# Patient Record
Sex: Male | Born: 1970 | Race: Black or African American | Hispanic: No | Marital: Married | Smoking: Never smoker
Health system: Southern US, Community
[De-identification: ages and names within clinical notes are randomized; demographics above are authoritative.]

## PROBLEM LIST (undated history)

## (undated) DIAGNOSIS — K429 Umbilical hernia without obstruction or gangrene: Secondary | ICD-10-CM

## (undated) DIAGNOSIS — G4733 Obstructive sleep apnea (adult) (pediatric): Secondary | ICD-10-CM

## (undated) HISTORY — DX: Umbilical hernia without obstruction or gangrene: K42.9

## (undated) HISTORY — DX: Obstructive sleep apnea (adult) (pediatric): G47.33

## (undated) HISTORY — PX: NO PAST SURGERIES: SHX2092

---

## 2005-08-25 ENCOUNTER — Emergency Department (HOSPITAL_COMMUNITY): Admission: EM | Admit: 2005-08-25 | Discharge: 2005-08-25 | Payer: Self-pay | Admitting: Emergency Medicine

## 2013-08-01 ENCOUNTER — Encounter: Payer: Self-pay | Admitting: Physician Assistant

## 2013-08-01 ENCOUNTER — Ambulatory Visit (INDEPENDENT_AMBULATORY_CARE_PROVIDER_SITE_OTHER): Payer: BC Managed Care – PPO | Admitting: Physician Assistant

## 2013-08-01 VITALS — BP 124/70 | HR 60 | Temp 98.3°F | Resp 16 | Wt 236.0 lb

## 2013-08-01 DIAGNOSIS — Z0000 Encounter for general adult medical examination without abnormal findings: Secondary | ICD-10-CM

## 2013-08-01 NOTE — Progress Notes (Signed)
   Patient ID: Manuel Hernandez is a 43 y.o. male DOB: [id number] 24, 1972 MRN: [id number]     HPI:  Patient is a 43 year old male who present to the office to establish care. Patient has not had a PCP in a number of years. Patient is married and works as a Systems analystpersonal trainer. Considers himself in good health with no concerns at this time. Reports takes an OTC multi-vitamin daily. Denies chest pain/palpitations, SOB, cough, extremity swelling, pain/difficulty swallowing, change in bowel/bladder habits, visual change/disturbance, numbness, tingling or weakness.     Influenza: does not take Tetanus: unknown  ROS: As stated in HPI. All other systems negative  History reviewed. No pertinent past medical history. Family History  Problem Relation Age of Onset  . Hypertension Mother    History   Social History  . Marital Status: Married    Spouse Name: N/A    Number of Children: N/A  . Years of Education: N/A   Social History Main Topics  . Smoking status: Never Smoker   . Smokeless tobacco: None  . Alcohol Use: Yes  . Drug Use: None  . Sexual Activity: None   Other Topics Concern  . None   Social History Narrative  . None   History reviewed. No pertinent past surgical history. No current outpatient prescriptions on file prior to visit.   No current facility-administered medications on file prior to visit.   Not on File  PE:  Filed Vitals:   08/01/13 1117  BP: 124/70  Pulse: 60  Temp: 98.3 F (36.8 C)  Resp: 16    CONSTITUTIONAL: Well developed, well nourished, pleasant, appears stated age, in NAD HEENT: normocephalic, atraumatic, bilateral ext/int canals normal. Bilateral TM's without injections, bulging, erythema. Nose normal, uvula midline, oropharynx clear and moist. EYES: PERRLA, bilateral EOM and conjunctiva normal NECK: FROM, supple, without thyromegaly or mass CARDIO: RRR, normal S1 and S2, distal pulses intact. PULM/CHEST CTA bilateral, no wheezes, rales or rhonchi. Non  tender. ABD: appearance normal, soft, nontender. Normal bowel sounds x 4 quadrants, non palpable spleen, liver, kidney MUSC: FROM U/LE bilateral, FROM thoracic and lumbar spine, no midline tenderness.  LYMPH: no cervical, supraclavicular adenopathy NEURO: alert and oriented x 3, no cranial nerve deficit, motor strength and coordination NL. DTR's intact. Gait normal. SKIN: warm, dry, no rash or lesions noted. PSYCH: Mood and affect normal, speech normal.   ASSESSMENT and PLAN   CPX/v70.0 - Patient has been counseled on age-appropriate routine health concerns for screening and prevention. These are reviewed and up-to-date. Immunizations are up-to-date or declined. Labs ordered and will be reviewed.  RTO in one year unless needed sooner.

## 2013-08-01 NOTE — Progress Notes (Signed)
Pre visit review using our clinic review tool, if applicable. No additional management support is needed unless otherwise documented below in the visit note. 

## 2013-08-01 NOTE — Patient Instructions (Signed)
It was great meeting you today [name]!   Labs have been ordered for you, when you report to lab please be fasting.    Health Maintenance, Males A healthy lifestyle and preventative care can promote health and wellness.  Maintain regular health, dental, and eye exams.  Eat a healthy diet. Foods like vegetables, fruits, whole grains, low-fat dairy products, and lean protein foods contain the nutrients you need and are low in calories. Decrease your intake of foods high in solid fats, added sugars, and salt. Get information about a proper diet from your health care provider, if necessary.  Regular physical exercise is one of the most important things you can do for your health. Most adults should get at least 150 minutes of moderate-intensity exercise (any activity that increases your heart rate and causes you to sweat) each week. In addition, most adults need muscle-strengthening exercises on 2 or more days a week.   Maintain a healthy weight. The body mass index (BMI) is a screening tool to identify possible weight problems. It provides an estimate of body fat based on height and weight. Your health care provider can find your BMI and can help you achieve or maintain a healthy weight. For males 20 years and older:  A BMI below 18.5 is considered underweight.  A BMI of 18.5 to 24.9 is normal.  A BMI of 25 to 29.9 is considered overweight.  A BMI of 30 and above is considered obese.  Maintain normal blood lipids and cholesterol by exercising and minimizing your intake of saturated fat. Eat a balanced diet with plenty of fruits and vegetables. Blood tests for lipids and cholesterol should begin at age 10620 and be repeated every 5 years. If your lipid or cholesterol levels are high, you are over 50, or you are at high risk for heart disease, you may need your cholesterol levels checked more frequently.Ongoing high lipid and cholesterol levels should be treated with medicines, if diet and  exercise are not working.  If you smoke, find out from your health care provider how to quit. If you do not use tobacco, do not start.  Lung cancer screening is recommended for adults aged 41 80 years who are at high risk for developing lung cancer because of a history of smoking. A yearly low-dose CT scan of the lungs is recommended for people who have at least a 30-pack-year history of smoking and are a current smoker or have quit within the past 15 years. A pack year of smoking is smoking an average of 1 pack of cigarettes a day for 1 year (for example, a 30-pack-year history of smoking could mean smoking 1 pack a day for 30 years or 2 packs a day for 15 years). Yearly screening should continue until the smoker has stopped smoking for at least 15 years. Yearly screening should be stopped for people who develop a health problem that would prevent them from having lung cancer treatment.  If you choose to drink alcohol, do not have more than 2 drinks per day. One drink is considered to be 12 oz (360 mL) of beer, 5 oz (150 mL) of wine, or 1.5 oz (45 mL) of liquor.  Avoid use of street drugs. Do not share needles with anyone. Ask for help if you need support or instructions about stopping the use of drugs.  High blood pressure causes heart disease and increases the risk of stroke. Blood pressure should be checked at least every 1 2 years. Ongoing  high blood pressure should be treated with medicines if weight loss and exercise are not effective.  If you are 96 41 years old, ask your health care provider if you should take aspirin to prevent heart disease.  Diabetes screening involves taking a blood sample to check your fasting blood sugar level. This should be done once every 3 years after age 75, if you are at a normal weight and without risk factors for diabetes. Testing should be considered at a younger age or be carried out more frequently if you are overweight and have at least 1 risk factor for  diabetes.  Colorectal cancer can be detected and often prevented. Most routine colorectal cancer screening begins at the age of 67 and continues through age 24. However, your health care provider may recommend screening at an earlier age if you have risk factors for colon cancer. On a yearly basis, your health care provider may provide home test kits to check for hidden blood in the stool. A small camera at the end of a tube may be used to directly examine the colon (sigmoidoscopy or colonoscopy) to detect the earliest forms of colorectal cancer. Talk to your health care provider about this at age 16, when routine screening begins. A direct exam of the colon should be repeated every 5 10 years through age 74, unless early forms of pre-cancerous polyps or small growths are found.  People who are at an increased risk for hepatitis B should be screened for this virus. You are considered at high risk for hepatitis B if:  You were born in a country where hepatitis B occurs often. Talk with your health care provider about which countries are considered high-risk.  Your parents were born in a high-risk country and you have not received a shot to protect against hepatitis B (hepatitis B vaccine).  You have HIV or AIDS.  You use needles to inject street drugs.  You live with, or have sex with, someone who has hepatitis B.  You are a man who has sex with other men (MSM).  You get hemodialysis treatment.  You take certain medicines for conditions like cancer, organ transplantation, and autoimmune conditions.  Hepatitis C blood testing is recommended for all people born from [birth year] through 1965 and any individual with known risk factors for hepatitis C.  Healthy men should no longer receive prostate-specific antigen (PSA) blood tests as part of routine cancer screening. Talk to your health care provider about prostate cancer screening.  Testicular cancer screening is not recommended for adolescents or  adult males who have no symptoms. Screening includes self-exam, a health care provider exam, and other screening tests. Consult with your health care provider about any symptoms you have or any concerns you have about testicular cancer.  Practice safe sex. Use condoms and avoid high-risk sexual practices to reduce the spread of sexually transmitted infections (STIs).  Use sunscreen. Apply sunscreen liberally and repeatedly throughout the day. You should seek shade when your shadow is shorter than you. Protect yourself by wearing long sleeves, pants, a wide-brimmed hat, and sunglasses year round, whenever you are outdoors.  Tell your health care provider of new moles or changes in moles, especially if there is a change in shape or color. Also tell your provider if a mole is larger than the size of a pencil eraser.  A one-time screening for abdominal aortic aneurysm (AAA) and surgical repair of large AAAs by ultrasound is recommended for men aged 67 75 years who  are current or former smokers.  Stay current with your vaccines (immunizations). Document Released: 11/08/2007 Document Revised: 03/02/2013 Document Reviewed: 10/07/2010 [facility] Patient Information 2014 [place], [place].

## 2014-10-25 ENCOUNTER — Ambulatory Visit (INDEPENDENT_AMBULATORY_CARE_PROVIDER_SITE_OTHER): Payer: 59 | Admitting: Internal Medicine

## 2014-10-25 VITALS — BP 130/82 | HR 63 | Temp 98.4°F | Resp 18 | Ht 73.5 in | Wt 239.0 lb

## 2014-10-25 DIAGNOSIS — R1033 Periumbilical pain: Secondary | ICD-10-CM | POA: Diagnosis not present

## 2014-10-25 DIAGNOSIS — Q899 Congenital malformation, unspecified: Secondary | ICD-10-CM

## 2014-10-25 LAB — POCT CBC
GRANULOCYTE PERCENT: 57.1 % (ref 37–80)
HCT, POC: 49.2 % (ref 43.5–53.7)
Hemoglobin: 16.6 g/dL (ref 14.1–18.1)
LYMPH, POC: 2.1 (ref 0.6–3.4)
MCH, POC: 29.4 pg (ref 27–31.2)
MCHC: 33.9 g/dL (ref 31.8–35.4)
MCV: 86.8 fL (ref 80–97)
MID (cbc): 0.4 (ref 0–0.9)
MPV: 6.6 fL (ref 0–99.8)
POC Granulocyte: 3.4 (ref 2–6.9)
POC LYMPH PERCENT: 36.2 %L (ref 10–50)
POC MID %: 6.7 % (ref 0–12)
Platelet Count, POC: 203 10*3/uL (ref 142–424)
RBC: 5.67 M/uL (ref 4.69–6.13)
RDW, POC: 13.2 %
WBC: 5.9 10*3/uL (ref 4.6–10.2)

## 2014-10-25 LAB — LIPID PANEL
CHOLESTEROL: 200 mg/dL (ref 0–200)
HDL: 45 mg/dL (ref >=40)
LDL Cholesterol: 134 mg/dL — ABNORMAL HIGH (ref 0–99)
Total CHOL/HDL Ratio: 4.4 Ratio
Triglycerides: 105 mg/dL (ref <150)
VLDL: 21 mg/dL (ref 0–40)

## 2014-10-25 LAB — POCT URINALYSIS DIPSTICK
BILIRUBIN UA: NEGATIVE
Blood, UA: NEGATIVE
Glucose, UA: NEGATIVE
KETONES UA: NEGATIVE
LEUKOCYTES UA: NEGATIVE
NITRITE UA: NEGATIVE
PH UA: 7
Protein, UA: NEGATIVE
SPEC GRAV UA: 1.015
Urobilinogen, UA: 0.2

## 2014-10-25 LAB — POCT UA - MICROSCOPIC ONLY
Bacteria, U Microscopic: NEGATIVE
Crystals, Ur, HPF, POC: NEGATIVE
Epithelial cells, urine per micros: NEGATIVE
Mucus, UA: NEGATIVE

## 2014-10-25 LAB — BASIC METABOLIC PANEL
BUN: 15 mg/dL (ref 6–23)
CHLORIDE: 102 meq/L (ref 96–112)
CO2: 29 meq/L (ref 19–32)
Calcium: 9.1 mg/dL (ref 8.4–10.5)
Creat: 1.28 mg/dL (ref 0.50–1.35)
Glucose, Bld: 88 mg/dL (ref 70–99)
Potassium: 4.2 mEq/L (ref 3.5–5.3)
Sodium: 138 mEq/L (ref 135–145)

## 2014-10-25 LAB — HEPATIC FUNCTION PANEL
ALT: 29 U/L (ref 0–53)
AST: 25 U/L (ref 0–37)
Albumin: 4.2 g/dL (ref 3.5–5.2)
Alkaline Phosphatase: 49 U/L (ref 39–117)
Bilirubin, Direct: 0.1 mg/dL (ref 0.0–0.3)
Indirect Bilirubin: 0.5 mg/dL (ref 0.2–1.2)
Total Bilirubin: 0.6 mg/dL (ref 0.2–1.2)
Total Protein: 6.8 g/dL (ref 6.0–8.3)

## 2014-10-25 LAB — TSH: TSH: 1.556 u[IU]/mL (ref 0.350–4.500)

## 2014-10-25 NOTE — Progress Notes (Signed)
   Subjective:    Patient ID: Manuel Hernandez, male    DOB: July 21, 1970, 44 y.o.   MRN: [id number]  HPI    Review of Systems     Objective:   Physical Exam        Assessment & Plan:

## 2014-10-25 NOTE — Progress Notes (Signed)
   Subjective:    Patient ID: Manuel Hernandez, male    DOB: [birth year], 44 y.o.   MRN: [id number]  HPI 44 year old male complains of sharp abdominal pain. He first noticed it about 2 years ago. No lumps or swellings seen, no disch seen, no blood seen. No fever. Also needs labs for [facility].  Review of Systems  Constitutional: Negative.   HENT: Negative.   Eyes: Negative.   Respiratory: Negative.   Cardiovascular: Negative.   Gastrointestinal: Positive for abdominal pain. Negative for nausea and abdominal distention.  Genitourinary: Negative for dysuria, flank pain and difficulty urinating.  Psychiatric/Behavioral: Negative.        Objective:   Physical Exam  Constitutional: He is oriented to person, place, and time. He appears well-developed and well-nourished.  HENT:  Head: Normocephalic.  Right Ear: External ear normal.  Left Ear: External ear normal.  Nose: Nose normal.  Mouth/Throat: Oropharynx is clear and moist.  Eyes: Conjunctivae and EOM are normal. Pupils are equal, round, and reactive to light.  Neck: Normal range of motion. Neck supple.  Cardiovascular: Normal rate.   Pulmonary/Chest: Effort normal.  Abdominal: He exhibits no distension and no mass. There is tenderness.  Neurological: He is alert and oriented to person, place, and time. He exhibits normal muscle tone. Coordination normal.   CCUA clear Results for orders placed or performed in visit on 10/25/14  POCT CBC  Result Value Ref Range   WBC 5.9 4.6 - 10.2 K/uL   Lymph, poc 2.1 0.6 - 3.4   POC LYMPH PERCENT 36.2 10 - 50 %L   MID (cbc) 0.4 0 - 0.9   POC MID % 6.7 0 - 12 %M   POC Granulocyte 3.4 2 - 6.9   Granulocyte percent 57.1 37 - 80 %G   RBC 5.67 4.69 - 6.13 M/uL   Hemoglobin 16.6 14.1 - 18.1 g/dL   HCT, POC 40.949.2 81.143.5 - 53.7 %   MCV 86.8 80 - 97 fL   MCH, POC 29.4 27 - 31.2 pg   MCHC 33.9 31.8 - 35.4 g/dL   RDW, POC 91.413.2 %   Platelet Count, POC 203.0 142 - 424 K/uL   MPV 6.6  0 - 99.8 fL  POCT urinalysis dipstick  Result Value Ref Range   Color, UA Amber    Clarity, UA Clear    Glucose, UA Negative    Bilirubin, UA negative    Ketones, UA Negative    Spec Grav, UA 1.015    Blood, UA Negative    pH, UA 7.0    Protein, UA Negative    Urobilinogen, UA 0.2    Nitrite, UA Negative    Leukocytes, UA Negative           Assessment & Plan:  Umbilical pain Mid abdominal pain progressive over 2 years

## 2014-10-25 NOTE — Patient Instructions (Signed)

## 2014-11-01 ENCOUNTER — Ambulatory Visit
Admission: RE | Admit: 2014-11-01 | Discharge: 2014-11-01 | Disposition: A | Discharge status: Home / Self Care | Payer: 59 | Source: Ambulatory Visit | Attending: Internal Medicine | Admitting: Internal Medicine

## 2014-11-01 DIAGNOSIS — Q899 Congenital malformation, unspecified: Secondary | ICD-10-CM

## 2014-11-01 DIAGNOSIS — R1033 Periumbilical pain: Secondary | ICD-10-CM

## 2014-11-01 MED ORDER — IOPAMIDOL (ISOVUE-300) INJECTION 61%
100.0000 mL | Freq: Once | INTRAVENOUS | Status: AC | PRN
  Administered 2014-11-01: 100 mL via INTRAVENOUS

## 2014-11-06 ENCOUNTER — Telehealth: Payer: Self-pay

## 2014-11-06 NOTE — Telephone Encounter (Signed)
Pt called about labs. Let him know everything was normal.

## 2014-11-08 ENCOUNTER — Encounter: Payer: Self-pay | Admitting: Family Medicine

## 2015-01-17 ENCOUNTER — Telehealth: Payer: Self-pay | Admitting: Surgery

## 2015-01-17 NOTE — Telephone Encounter (Signed)
Manuel Hernandez, patient missed your call yesterday. He was calling back to speak with you. Possibly about a referral for surgery.

## 2015-01-18 ENCOUNTER — Encounter: Payer: Self-pay | Admitting: Surgery

## 2015-01-23 DIAGNOSIS — K429 Umbilical hernia without obstruction or gangrene: Secondary | ICD-10-CM

## 2015-01-24 ENCOUNTER — Encounter: Payer: Self-pay | Admitting: Surgery

## 2015-01-24 ENCOUNTER — Ambulatory Visit (INDEPENDENT_AMBULATORY_CARE_PROVIDER_SITE_OTHER): Payer: 59 | Admitting: Surgery

## 2015-01-24 VITALS — BP 139/79 | HR 55 | Temp 98.1°F | Ht 73.0 in | Wt 237.0 lb

## 2015-01-24 DIAGNOSIS — K429 Umbilical hernia without obstruction or gangrene: Secondary | ICD-10-CM | POA: Diagnosis not present

## 2015-01-24 NOTE — Progress Notes (Signed)
  Surgical Consultation  01/24/2015  Manuel Hernandez is an 44 y.o. male.   CC: Umbilical pain and bulge  HPI: This patient with several year history of umbilical pain and bulge the pain is been worsening and he points to the depths of his umbilicus. He has no fevers or chills no nausea or vomiting and has normal bowel movements.  Past Medical History  Diagnosis Date  . Umbilical hernia     Past Surgical History  Procedure Laterality Date  . No past surgeries      Family History  Problem Relation Age of Onset  . Lupus Mother   . Hypertension Father     Social History:  reports that he has never smoked. He has never used smokeless tobacco. He reports that he drinks alcohol. He reports that he does not use illicit drugs.  Allergies: No Known Allergies  Medications reviewed.   Review of Systems:   Review of Systems  Constitutional: Negative.   HENT: Negative.   Eyes: Negative.   Respiratory: Negative.   Cardiovascular: Negative.   Gastrointestinal: Positive for abdominal pain. Negative for nausea, vomiting and diarrhea.  Genitourinary: Negative.   Musculoskeletal: Negative.   Skin: Negative.   Neurological: Negative.   Endo/Heme/Allergies: Negative.   Psychiatric/Behavioral: Negative.      Physical Exam:  BP 139/79 mmHg  Pulse 55  Temp(Src) 98.1 F (36.7 C) (Oral)  Ht  (1.854 m)  Wt 237 lb (107.502 kg)  BMI 31.27 kg/m2  Physical Exam  Constitutional: He is oriented to person, place, and time and well-developed, well-nourished, and in no distress.  Muscular physical trainer multiple tattoos  HENT:  Head: Normocephalic and atraumatic.  Eyes: No scleral icterus.  Cardiovascular: Normal rate, regular rhythm and normal heart sounds.   Pulmonary/Chest: Effort normal and breath sounds normal. No respiratory distress. He has no wheezes. He has no rales.  Abdominal: Soft. He exhibits no distension. There is no tenderness. There is no rebound and no guarding.   Small subcentimeter umbilical hernia which is reducible and nontender  Musculoskeletal: He exhibits no edema.  Lymphadenopathy:    He has no cervical adenopathy.  Neurological: He is alert and oriented to person, place, and time.  Skin: Skin is warm and dry.  Psychiatric: Mood, affect and judgment normal.      No results found for this or any previous visit (from the past 48 hour(s)). No results found.  Assessment/Plan:  Small umbilical hernia discuss options with patient. Patient wishes to proceed with surgery at described suture primary repair or mesh placement and the risks of bleeding infection recurrence mesh placement mesh infection. I also discussed cosmetic problems. I reviewed with him the fact that this is a subcentimeter defect and could likely be repaired with sutures in a primary fashion however there is an increased risk of recurrence and understood and agreed with this plan.  Lattie Haw, MD, FACS

## 2015-01-25 ENCOUNTER — Telehealth: Payer: Self-pay | Admitting: Surgery

## 2015-01-25 NOTE — Telephone Encounter (Signed)
I have called Patient to advised him of his pre op date/time and sx date. No answer. I have left a message on voicemail.  Sx: 02/20/15 with Dr Perlie Mayo Umbilical hernia repair  Pre op: 02/14/15 between 9-1:00pm--telephone.

## 2015-01-30 ENCOUNTER — Telehealth: Payer: Self-pay | Admitting: Surgery

## 2015-01-30 NOTE — Telephone Encounter (Signed)
I have called pt to advise him of his insurance not being active. Pt is going to contact me back after contacting employer about insurance. Pt is considered self pay at this time.

## 2015-02-14 ENCOUNTER — Inpatient Hospital Stay: Admission: RE | Admit: 2015-02-14 | Payer: 59 | Source: Ambulatory Visit

## 2015-02-16 ENCOUNTER — Telehealth: Payer: Self-pay | Admitting: Surgery

## 2015-02-16 NOTE — Telephone Encounter (Signed)
I have tried contacting patient at all numbers listed. No answer. Pt did not answer for his telephone pre op with Pre Admit Testing. I have left a message asking for him to call the office back to discuss if he would like to keep surgery date of 02/20/15.

## 2015-02-19 ENCOUNTER — Telehealth: Payer: Self-pay

## 2015-02-19 NOTE — Telephone Encounter (Signed)
Notified by Pre-admission Testing that they have attempted to call patient on 9/20, 9/21, and this morning 9/26 with no success. They have left messages all times that patient was called.  Called patient and Emergency Contact numbers to find out if patient is continuing with surgery tomorrow. No answer. Left voicemail asking for return phone call.

## 2015-02-19 NOTE — OR Nursing (Signed)
Patient left message 02/14/15,02/16/15,02/19/15 but has not returned call to complete preop interview. Left message with instructions and notified Dr Excell Seltzer office.

## 2015-02-20 ENCOUNTER — Encounter: Admission: RE | Payer: Self-pay | Source: Ambulatory Visit

## 2015-02-20 ENCOUNTER — Ambulatory Visit: Admission: RE | Admit: 2015-02-20 | Payer: 59 | Source: Ambulatory Visit | Admitting: Surgery

## 2015-02-20 ENCOUNTER — Telehealth: Payer: Self-pay

## 2015-02-20 SURGERY — REPAIR, HERNIA, UMBILICAL, ADULT
Anesthesia: General

## 2015-02-20 MED ORDER — CEFAZOLIN SODIUM-DEXTROSE 2-3 GM-% IV SOLR: 2.0000 g | INTRAVENOUS | Status: DC

## 2015-02-20 MED ORDER — LACTATED RINGERS IV SOLN: INTRAVENOUS | Status: DC

## 2015-02-20 NOTE — Telephone Encounter (Signed)
Tried to call patient at this time. Again, no answer. Left voicemail asking for return phone call regarding surgery today.

## 2015-02-20 NOTE — Telephone Encounter (Signed)
Received call from patient at this time stating that he would need to cancel surgery for today. I explained that his surgery time today has already past.  He says that he is having a problem with his insurance and will call back when he is able to reschedule.

## 2015-04-25 ENCOUNTER — Encounter: Payer: Self-pay | Admitting: Surgery

## 2015-05-11 ENCOUNTER — Ambulatory Visit (INDEPENDENT_AMBULATORY_CARE_PROVIDER_SITE_OTHER): Payer: 59 | Admitting: Surgery

## 2015-05-11 ENCOUNTER — Encounter: Payer: Self-pay | Admitting: Surgery

## 2015-05-11 VITALS — BP 148/83 | HR 58 | Temp 98.4°F | Ht 73.0 in | Wt 240.0 lb

## 2015-05-11 DIAGNOSIS — K429 Umbilical hernia without obstruction or gangrene: Secondary | ICD-10-CM | POA: Diagnosis not present

## 2015-05-11 NOTE — Progress Notes (Signed)
Outpatient Surgical Follow Up  05/11/2015  Manuel Hernandez is an 44 y.o. male.   CC: Umbilical hernia  HPI: This patient with a small umbilical hernia who was scheduled for surgery in the past and he canceled that surgery. He had trouble keeping the appointment with preop testing etc. His personal trainer. His pain associated with this is minimal and positional he notices a very small bulge. No nausea vomiting fevers or chills.  Past Medical History  Diagnosis Date  . Umbilical hernia     Past Surgical History  Procedure Laterality Date  . No past surgeries      Family History  Problem Relation Age of Onset  . Lupus Mother   . Hypertension Father     Social History:  reports that he has never smoked. He has never used smokeless tobacco. He reports that he drinks alcohol. He reports that he does not use illicit drugs.  Allergies: No Known Allergies  Medications reviewed.   Review of Systems:   Review of Systems  Constitutional: Negative.   HENT: Negative.   Eyes: Negative.   Respiratory: Negative.   Cardiovascular: Negative.   Gastrointestinal: Positive for abdominal pain. Negative for heartburn, nausea, vomiting, diarrhea, constipation, blood in stool and melena.  Genitourinary: Negative.   Musculoskeletal: Negative.   Skin: Negative.   Neurological: Negative.   Endo/Heme/Allergies: Negative.   Psychiatric/Behavioral: Negative.      Physical Exam:  BP 148/83 mmHg  Pulse 58  Temp(Src) 98.4 F (36.9 C) (Oral)  Ht 6\' 1"  (1.854 m)  Wt 240 lb (108.863 kg)  BMI 31.67 kg/m2  Physical Exam  Constitutional: He is oriented to person, place, and time and well-developed, well-nourished, and in no distress. No distress.  Muscular  HENT:  Head: Normocephalic and atraumatic.  Eyes: Pupils are equal, round, and reactive to light. Right eye exhibits no discharge. Left eye exhibits no discharge. No scleral icterus.  Cardiovascular: Normal rate, regular rhythm and normal  heart sounds.   Pulmonary/Chest: Effort normal and breath sounds normal. No respiratory distress. He has no wheezes. He has no rales.  Abdominal: Soft. He exhibits no distension. There is no tenderness. There is no rebound and no guarding.  Small umbilical hernia which is reducible and in the very caudad portion of the umbilicus.  Musculoskeletal: Normal range of motion.  Lymphadenopathy:    He has no cervical adenopathy.  Neurological: He is alert and oriented to person, place, and time.  Skin: Skin is warm and dry.  Psychiatric: Mood and affect normal.      No results found for this or any previous visit (from the past 48 hour(s)). No results found.  Assessment/Plan:  Umbilical hernia requesting repair. It is small and minimally symptomatic. We'll plan repair will not likely reek require mesh. The options of observation reviewed the risk of bleeding infection recurrence mesh placement mesh infection and cosmetic deformity was discussed with him emphasized the risk of the umbilicus not looking the same postoperatively due to scar tissue and suturing. The degree to proceed  Lattie Hawichard E Hassan Blackshire, MD, FACS

## 2015-05-11 NOTE — Patient Instructions (Addendum)
We will see you on January for your surgery. If you have any questions before surgery, please give us a call.

## 2015-05-14 ENCOUNTER — Telehealth: Payer: Self-pay | Admitting: Surgery

## 2015-05-14 NOTE — Telephone Encounter (Signed)
Pt advised of pre op date/time and sx date. Sx: 06/05/15 with Dr Jearld Shinesooper--open umbilical hernia repair. Pre op: 05/29/15 between 1-5pm--phone.

## 2015-05-14 NOTE — Telephone Encounter (Signed)
I have contacted Oswaldo DoneKristy Smith with the patient's PCP. [facility] beginning 05/11/15 with Dr Excell Seltzerooper. Referral # [id number].

## 2015-05-29 ENCOUNTER — Inpatient Hospital Stay: Admission: RE | Admit: 2015-05-29 | Payer: 59 | Source: Ambulatory Visit

## 2015-05-30 ENCOUNTER — Telehealth: Payer: Self-pay | Admitting: Surgery

## 2015-05-30 NOTE — Telephone Encounter (Signed)
[facility] received a precert for patient's hernia Surgery, And patient does not have [facility] anymore.

## 2015-06-06 ENCOUNTER — Telehealth: Payer: Self-pay | Admitting: Surgery

## 2015-06-06 NOTE — Telephone Encounter (Signed)
I have called patient to advise him of pre op date/time and sx date. No answer. I have left a message on voicemail.  Sx: 07/13/15 with Dr Cooper--Open umbilical hernia repair Pre op: 07/09/15 between 9-1pm--phone.

## 2015-06-06 NOTE — Telephone Encounter (Signed)
No authorization is required for CPT: 49585 per Delrae RendKayla S with [employer]. 06/06/15 @ 11:12am. Policy is active as of 05/27/15.

## 2015-06-13 NOTE — Telephone Encounter (Signed)
Patient has called back and was informed of his pre op date and time as well as his surgery date. Patient confirmed.

## 2015-07-09 ENCOUNTER — Other Ambulatory Visit: Payer: 59

## 2015-07-12 ENCOUNTER — Encounter: Payer: Self-pay | Admitting: *Deleted

## 2015-07-12 NOTE — Patient Instructions (Signed)
  Your procedure is scheduled on: 07-13-15 Report to [facility] 2ND FLOOR To find out your arrival time please call [phone] between 1PM - 3PM on 07-12-15  Remember: Instructions that are not followed completely may result in serious medical risk, up to and including death, or upon the discretion of your surgeon and anesthesiologist your surgery may need to be rescheduled.    _X___ 1. Do not eat food or drink liquids after midnight. No gum chewing or hard candies.     _X___ 2. No Alcohol for 24 hours before or after surgery.   ____ 3. Bring all medications with you on the day of surgery if instructed.    ____ 4. Notify your doctor if there is any change in your medical condition     (cold, fever, infections).     Do not wear jewelry, make-up, hairpins, clips or nail polish.  Do not wear lotions, powders, or perfumes. You may wear deodorant.  Do not shave 48 hours prior to surgery. Men may shave face and neck.  Do not bring valuables to the hospital.    [facility] is not responsible for any belongings or valuables.               Contacts, dentures or bridgework may not be worn into surgery.  Leave your suitcase in the car. After surgery it may be brought to your room.  For patients admitted to the hospital, discharge time is determined by your    treatment team.   Patients discharged the day of surgery will not be allowed to drive home.   Please read over the following fact sheets that you were given:     ____ Take these medicines the morning of surgery with A SIP OF WATER:    1. NONE  2.   3.   4.  5.  6.  ____ Fleet Enema (as directed)   ____ Use CHG Soap as directed  ____ Use inhalers on the day of surgery  ____ Stop metformin 2 days prior to surgery    ____ Take 1/2 of usual insulin dose the night before surgery and none on the morning of surgery.   ____ Stop Coumadin/Plavix/aspirin-N/A  ____ Stop Anti-inflammatories-NO NSAIDS OR ASA  PRODUCTS-TYLENOL OK TO TAKE   ____ Stop supplements until after surgery.    ____ Bring C-Pap to the hospital.

## 2015-07-13 ENCOUNTER — Ambulatory Visit: Payer: BLUE CROSS/BLUE SHIELD | Admitting: Anesthesiology

## 2015-07-13 ENCOUNTER — Encounter
Admission: RE | Disposition: A | Discharge status: Home / Self Care | Payer: Self-pay | Source: Ambulatory Visit | Attending: Surgery

## 2015-07-13 ENCOUNTER — Ambulatory Visit
Admission: RE | Admit: 2015-07-13 | Discharge: 2015-07-13 | Disposition: A | Discharge status: Home / Self Care | Payer: BLUE CROSS/BLUE SHIELD | Source: Ambulatory Visit | Attending: Surgery | Admitting: Surgery

## 2015-07-13 DIAGNOSIS — Z79899 Other long term (current) drug therapy: Secondary | ICD-10-CM | POA: Diagnosis not present

## 2015-07-13 DIAGNOSIS — K429 Umbilical hernia without obstruction or gangrene: Secondary | ICD-10-CM | POA: Diagnosis not present

## 2015-07-13 DIAGNOSIS — Z8249 Family history of ischemic heart disease and other diseases of the circulatory system: Secondary | ICD-10-CM | POA: Insufficient documentation

## 2015-07-13 DIAGNOSIS — Z8489 Family history of other specified conditions: Secondary | ICD-10-CM | POA: Diagnosis not present

## 2015-07-13 DIAGNOSIS — Z91018 Allergy to other foods: Secondary | ICD-10-CM | POA: Insufficient documentation

## 2015-07-13 HISTORY — PX: UMBILICAL HERNIA REPAIR: SHX196

## 2015-07-13 LAB — CBC WITH DIFFERENTIAL/PLATELET
BASOS ABS: 0 10*3/uL (ref 0–0.1)
Basophils Relative: 1 %
Eosinophils Absolute: 0.3 10*3/uL (ref 0–0.7)
Eosinophils Relative: 5 %
HCT: 45.7 % (ref 40.0–52.0)
HEMOGLOBIN: 15.7 g/dL (ref 13.0–18.0)
LYMPHS ABS: 2.4 10*3/uL (ref 1.0–3.6)
LYMPHS PCT: 40 %
MCH: 30.2 pg (ref 26.0–34.0)
MCHC: 34.3 g/dL (ref 32.0–36.0)
MCV: 88 fL (ref 80.0–100.0)
Monocytes Absolute: 0.6 10*3/uL (ref 0.2–1.0)
Monocytes Relative: 11 %
NEUTROS ABS: 2.5 10*3/uL (ref 1.4–6.5)
NEUTROS PCT: 43 %
Platelets: 158 10*3/uL (ref 150–440)
RBC: 5.2 MIL/uL (ref 4.40–5.90)
RDW: 13.4 % (ref 11.5–14.5)
WBC: 5.9 10*3/uL (ref 3.8–10.6)

## 2015-07-13 LAB — BASIC METABOLIC PANEL
ANION GAP: 5 (ref 5–15)
BUN: 20 mg/dL (ref 6–20)
CHLORIDE: 106 mmol/L (ref 101–111)
CO2: 28 mmol/L (ref 22–32)
Calcium: 8.8 mg/dL — ABNORMAL LOW (ref 8.9–10.3)
Creatinine, Ser: 1.32 mg/dL — ABNORMAL HIGH (ref 0.61–1.24)
GFR calc non Af Amer: >60 mL/min (ref >60)
Glucose, Bld: 96 mg/dL (ref 65–99)
POTASSIUM: 4.3 mmol/L (ref 3.5–5.1)
SODIUM: 139 mmol/L (ref 135–145)

## 2015-07-13 SURGERY — REPAIR, HERNIA, UMBILICAL, ADULT
Anesthesia: General | Wound class: Clean

## 2015-07-13 MED ORDER — HEPARIN SODIUM (PORCINE) 5000 UNIT/ML IJ SOLN
INTRAMUSCULAR | Status: AC
  Administered 2015-07-13: 5000 [IU] via SUBCUTANEOUS
  Filled 2015-07-13: qty 1

## 2015-07-13 MED ORDER — HEPARIN SODIUM (PORCINE) 5000 UNIT/ML IJ SOLN
5000.0000 [IU] | Freq: Once | INTRAMUSCULAR | Status: AC
  Administered 2015-07-13: 5000 [IU] via SUBCUTANEOUS

## 2015-07-13 MED ORDER — LIDOCAINE HCL (CARDIAC) 20 MG/ML IV SOLN
INTRAVENOUS | Status: DC | PRN
  Administered 2015-07-13: 40 mg via INTRAVENOUS

## 2015-07-13 MED ORDER — ONDANSETRON HCL 4 MG/2ML IJ SOLN: 4.0000 mg | Freq: Once | INTRAMUSCULAR | Status: DC | PRN

## 2015-07-13 MED ORDER — ONDANSETRON HCL 4 MG/2ML IJ SOLN
INTRAMUSCULAR | Status: DC | PRN
  Administered 2015-07-13: 4 mg via INTRAVENOUS

## 2015-07-13 MED ORDER — BUPIVACAINE-EPINEPHRINE (PF) 0.25% -1:200000 IJ SOLN
INTRAMUSCULAR | Status: AC
  Filled 2015-07-13: qty 30

## 2015-07-13 MED ORDER — FENTANYL CITRATE (PF) 100 MCG/2ML IJ SOLN
INTRAMUSCULAR | Status: DC | PRN
  Administered 2015-07-13: 100 ug via INTRAVENOUS

## 2015-07-13 MED ORDER — FENTANYL CITRATE (PF) 100 MCG/2ML IJ SOLN: 25.0000 ug | INTRAMUSCULAR | Status: DC | PRN

## 2015-07-13 MED ORDER — FAMOTIDINE 20 MG PO TABS
ORAL_TABLET | ORAL | Status: AC
  Administered 2015-07-13: 20 mg via ORAL
  Filled 2015-07-13: qty 1

## 2015-07-13 MED ORDER — OXYCODONE-ACETAMINOPHEN 5-325 MG PO TABS: 1.0000 | ORAL_TABLET | ORAL | Status: DC | PRN

## 2015-07-13 MED ORDER — CEFAZOLIN SODIUM-DEXTROSE 2-3 GM-% IV SOLR
2.0000 g | INTRAVENOUS | Status: AC
  Administered 2015-07-13 (×2): 2 g via INTRAVENOUS

## 2015-07-13 MED ORDER — BUPIVACAINE-EPINEPHRINE (PF) 0.25% -1:200000 IJ SOLN
INTRAMUSCULAR | Status: DC | PRN
  Administered 2015-07-13: 30 mL

## 2015-07-13 MED ORDER — PROPOFOL 10 MG/ML IV BOLUS
INTRAVENOUS | Status: DC | PRN
  Administered 2015-07-13: 180 mg via INTRAVENOUS

## 2015-07-13 MED ORDER — MIDAZOLAM HCL 2 MG/2ML IJ SOLN
INTRAMUSCULAR | Status: DC | PRN
  Administered 2015-07-13: 2 mg via INTRAVENOUS

## 2015-07-13 MED ORDER — ROCURONIUM BROMIDE 100 MG/10ML IV SOLN
INTRAVENOUS | Status: DC | PRN
  Administered 2015-07-13: 50 mg via INTRAVENOUS

## 2015-07-13 MED ORDER — FAMOTIDINE 20 MG PO TABS
20.0000 mg | ORAL_TABLET | Freq: Once | ORAL | Status: AC
  Administered 2015-07-13: 20 mg via ORAL

## 2015-07-13 MED ORDER — CEFAZOLIN SODIUM-DEXTROSE 2-3 GM-% IV SOLR
INTRAVENOUS | Status: AC
  Administered 2015-07-13: 2 g via INTRAVENOUS
  Filled 2015-07-13: qty 50

## 2015-07-13 MED ORDER — DEXAMETHASONE SODIUM PHOSPHATE 10 MG/ML IJ SOLN
INTRAMUSCULAR | Status: DC | PRN
  Administered 2015-07-13: 10 mg via INTRAVENOUS

## 2015-07-13 MED ORDER — GLYCOPYRROLATE 0.2 MG/ML IJ SOLN
INTRAMUSCULAR | Status: DC | PRN
  Administered 2015-07-13: .6 mg via INTRAVENOUS

## 2015-07-13 MED ORDER — KETOROLAC TROMETHAMINE 30 MG/ML IJ SOLN
INTRAMUSCULAR | Status: DC | PRN
  Administered 2015-07-13: 30 mg via INTRAVENOUS

## 2015-07-13 MED ORDER — NEOSTIGMINE METHYLSULFATE 10 MG/10ML IV SOLN
INTRAVENOUS | Status: DC | PRN
  Administered 2015-07-13: 5 mg via INTRAVENOUS

## 2015-07-13 MED ORDER — LACTATED RINGERS IV SOLN
INTRAVENOUS | Status: DC
  Administered 2015-07-13 (×2): via INTRAVENOUS

## 2015-07-13 SURGICAL SUPPLY — 28 items
ADHESIVE MASTISOL STRL (MISCELLANEOUS) ×2 IMPLANT
CANISTER SUCT 1200ML W/VALVE (MISCELLANEOUS) ×2 IMPLANT
DRAPE LAPAROTOMY 100X77 ABD (DRAPES) ×2 IMPLANT
ELECT REM PT RETURN 9FT ADLT (ELECTROSURGICAL) ×2
ELECTRODE REM PT RTRN 9FT ADLT (ELECTROSURGICAL) ×1 IMPLANT
GAUZE SPONGE 4X4 12PLY STRL (GAUZE/BANDAGES/DRESSINGS) IMPLANT
GAUZE SPONGE NON-WVN 2X2 STRL (MISCELLANEOUS) ×2 IMPLANT
GLOVE BIO SURGEON STRL SZ8 (GLOVE) ×16 IMPLANT
GOWN STRL REUS W/ TWL LRG LVL3 (GOWN DISPOSABLE) ×4 IMPLANT
GOWN STRL REUS W/TWL LRG LVL3 (GOWN DISPOSABLE) ×4
LABEL OR SOLS (LABEL) ×2 IMPLANT
MASK SURG TIE (MASK) IMPLANT
NDL SAFETY 22GX1.5 (NEEDLE) ×2 IMPLANT
NS IRRIG 500ML POUR BTL (IV SOLUTION) ×2 IMPLANT
PACK BASIN MINOR ARMC (MISCELLANEOUS) ×2 IMPLANT
SPONGE LAP 18X18 5 PK (GAUZE/BANDAGES/DRESSINGS) ×2 IMPLANT
SPONGE VERSALON 2X2 STRL (MISCELLANEOUS) ×2
STRIP CLOSURE SKIN 1/2X4 (GAUZE/BANDAGES/DRESSINGS) ×2 IMPLANT
SUT ETHIBOND 0 (SUTURE) ×4 IMPLANT
SUT ETHIBOND NAB CT1 #1 30IN (SUTURE) IMPLANT
SUT MNCRL 4-0 (SUTURE) ×1
SUT MNCRL 4-0 27XMFL (SUTURE) ×1
SUT PROLENE 0 CT 1 30 (SUTURE) ×4 IMPLANT
SUT PROLENE 1 CT 1 30 (SUTURE) IMPLANT
SUT VIC AB 3-0 SH 27 (SUTURE) ×2
SUT VIC AB 3-0 SH 27X BRD (SUTURE) ×2 IMPLANT
SUTURE MNCRL 4-0 27XMF (SUTURE) ×1 IMPLANT
SYRINGE 10CC LL (SYRINGE) ×2 IMPLANT

## 2015-07-13 NOTE — H&P (Signed)
Manuel Hernandez is an 45 y.o. male.    Chief Complaint: UH  HPI: UH, min pain  Past Medical History  Diagnosis Date  . Umbilical hernia     Past Surgical History  Procedure Laterality Date  . No past surgeries      Family History  Problem Relation Age of Onset  . Lupus Mother   . Hypertension Father    Social History:  reports that he has never smoked. He has never used smokeless tobacco. He reports that he drinks alcohol. He reports that he does not use illicit drugs.  Allergies:  Allergies  Allergen Reactions  . Other     NUTS-MOUTH SWELLING    Medications Prior to Admission  Medication Sig Dispense Refill  . Multiple Vitamin (MULTIVITAMIN) tablet Take 1 tablet by mouth daily.       Review of Systems  Constitutional: Negative.   HENT: Negative.   Eyes: Negative.   Respiratory: Negative.   Cardiovascular: Negative.   Gastrointestinal: Negative.   Genitourinary: Negative.   Musculoskeletal: Negative.   Skin: Negative.   Neurological: Negative.   Endo/Heme/Allergies: Negative.   Psychiatric/Behavioral: Negative.      Physical Exam:  BP 133/82 mmHg  Pulse 53  Temp(Src) 98.9 F (37.2 C) (Oral)  Resp 16  Ht 6' 1"  (1.854 m)  Wt 240 lb (108.863 kg)  BMI 31.67 kg/m2  SpO2 100%  Physical Exam  Constitutional: He is oriented to person, place, and time and well-developed, well-nourished, and in no distress. No distress.  HENT:  Head: Normocephalic and atraumatic.  Eyes: Pupils are equal, round, and reactive to light. Right eye exhibits no discharge. Left eye exhibits no discharge. No scleral icterus.  Neck: Normal range of motion.  Cardiovascular: Normal rate, regular rhythm and normal heart sounds.   Pulmonary/Chest: Effort normal and breath sounds normal. No respiratory distress. He has no wheezes. He has no rales.  Abdominal: Soft. Bowel sounds are normal. He exhibits no distension. There is no tenderness.  UH, small  Musculoskeletal: Normal range of  motion. He exhibits no edema.  Lymphadenopathy:    He has no cervical adenopathy.  Neurological: He is alert and oriented to person, place, and time.  Skin: Skin is warm and dry. He is not diaphoretic. No erythema.  Psychiatric: Mood and affect normal.        Results for orders placed or performed during the hospital encounter of 07/13/15 (from the past 48 hour(s))  Basic metabolic panel     Status: Abnormal   Collection Time: 07/13/15  6:31 AM  Result Value Ref Range   Sodium 139 135 - 145 mmol/L   Potassium 4.3 3.5 - 5.1 mmol/L   Chloride 106 101 - 111 mmol/L   CO2 28 22 - 32 mmol/L   Glucose, Bld 96 65 - 99 mg/dL   BUN 20 6 - 20 mg/dL   Creatinine, Ser 1.32 (H) 0.61 - 1.24 mg/dL   Calcium 8.8 (L) 8.9 - 10.3 mg/dL   GFR calc non Af Amer >60 >60 mL/min   GFR calc Af Amer >60 >60 mL/min    Comment: (NOTE) The eGFR has been calculated using the CKD EPI equation. This calculation has not been validated in all clinical situations. eGFR's persistently <60 mL/min signify possible Chronic Kidney Disease.    Anion gap 5 5 - 15  CBC WITH DIFFERENTIAL     Status: None   Collection Time: 07/13/15  6:31 AM  Result Value Ref Range   WBC  5.9 3.8 - 10.6 K/uL   RBC 5.20 4.40 - 5.90 MIL/uL   Hemoglobin 15.7 13.0 - 18.0 g/dL   HCT 45.7 40.0 - 52.0 %   MCV 88.0 80.0 - 100.0 fL   MCH 30.2 26.0 - 34.0 pg   MCHC 34.3 32.0 - 36.0 g/dL   RDW 13.4 11.5 - 14.5 %   Platelets 158 150 - 440 K/uL   Neutrophils Relative % 43 %   Neutro Abs 2.5 1.4 - 6.5 K/uL   Lymphocytes Relative 40 %   Lymphs Abs 2.4 1.0 - 3.6 K/uL   Monocytes Relative 11 %   Monocytes Absolute 0.6 0.2 - 1.0 K/uL   Eosinophils Relative 5 %   Eosinophils Absolute 0.3 0 - 0.7 K/uL   Basophils Relative 1 %   Basophils Absolute 0.0 0 - 0.1 K/uL   No results found.   Assessment/Plan  UH, plan repair Risk and options rev'd. I reviewed the risks of bleeding infection mesh placement mesh infection mesh removal and  especially emphasized the risk of cosmetic deformity he understood and agreed to proceed his wife was present as well Agrees with plan   Florene Glen, MD, FACS

## 2015-07-13 NOTE — Transfer of Care (Signed)
Immediate Anesthesia Transfer of Care Note  Patient: Manuel Hernandez  Procedure(s) Performed: Procedure(s): HERNIA REPAIR UMBILICAL ADULT (N/A)  Patient Location: PACU  Anesthesia Type:General  Level of Consciousness: sedated  Airway & Oxygen Therapy: Patient Spontanous Breathing and Patient connected to face mask oxygen  Post-op Assessment: Report given to RN and Post -op Vital signs reviewed and stable  Post vital signs: Reviewed and stable  Last Vitals:  Filed Vitals:   07/13/15 0606 07/13/15 0609  BP:  133/82  Pulse:  53  Temp: 37.2 C   Resp: 16     Complications: No apparent anesthesia complications

## 2015-07-13 NOTE — Progress Notes (Signed)
Preoperative Review   Patient is met in the preoperative holding area. The history is reviewed in the chart and with the patient. I personally reviewed the options and rationale as well as the risks of this procedure that have been previously discussed with the patient. All questions asked by the patient and/or family were answered to their satisfaction.  Patient agrees to proceed with this procedure at this time.  English Craighead E Lamiya Naas M.D. FACS  

## 2015-07-13 NOTE — Op Note (Signed)
Abdominal Hernia Repair  Pre-operative Diagnosis: Umbilical hernia  Post-operative Diagnosis: Umbilical hernia  Surgeon: Adah Salvage. Excell Seltzer, MD FACS  Anesthesia: Gen. with endotracheal tube  Assistant: E a student  Procedure Details  The patient was seen again in the Holding Room. The benefits, complications, treatment options, and expected outcomes were discussed with the patient. The risks of bleeding, infection, recurrence of symptoms, failure to resolve symptoms, bowel injury, mesh placement, mesh infection, any of which could require further surgery were reviewed with the patient. The likelihood of improving the patient's symptoms with return to their baseline status is good.  The patient was especially counseled concerning the cosmetic risks associated with this and the fact that repair of the hernia may not result in a cosmetic outcome that is the same as his preoperative state. The patient and family concurred with the proposed plan, giving informed consent.  The patient was taken to Operating Room, identified as Manuel Hernandez and the procedure verified.  A Time Out was held and the above information confirmed.  Prior to the induction of general anesthesia, antibiotic prophylaxis was administered. VTE prophylaxis was in place. General endotracheal anesthesia was then administered and tolerated well. After the induction, the abdomen was prepped with Chloraprep and draped in the sterile fashion. The patient was positioned in the supine position.  Local anesthetic was infiltrated into the skin and subcutaneous tissues tissues on the periumbilical area and infraumbilical incision was made with electrocautery in a curvilinear manner. Dissection down to the fascia was performed. The hernia sac was reduced and the fascial edges were cleaned.  Closure of the hernial rent which measured approximately 5 mm was performed with figure-of-eight 0 Ethibonds. Additional Marcaine was placed for a total of 30  cc. The wound was then closed after assuring that the sponge lap needle count was correct. Closure was performed utilizing deep sutures of 3-0 Vicryl tacking the skin of the umbilicus back to the fascia and then further layers of deep Vicryl followed by 4-0 subcuticular Monocryl. Steri-Strips Mastisol and sterile dressings were placed.  Patient ordered the procedure well and workup occasions he was taken the recovery room in stable condition to be discharged care of his family and follow-up in 10 days  Findings:   5mm UH  Estimated Blood Loss: nil         Drains: none         Specimens: none          Complications: none               Manuel Hernandez E. Excell Seltzer, MD, FACS

## 2015-07-13 NOTE — Anesthesia Preprocedure Evaluation (Signed)
Anesthesia Evaluation  Patient identified by MRN, date of birth, ID band Patient awake    Reviewed: Allergy & Precautions, NPO status , Patient's Chart, lab work & pertinent test results  Airway Mallampati: II  TM Distance: >3 FB     Dental  (+) Chipped   Pulmonary neg pulmonary ROS,    Pulmonary exam normal breath sounds clear to auscultation       Cardiovascular negative cardio ROS Normal cardiovascular exam     Neuro/Psych negative neurological ROS  negative psych ROS   GI/Hepatic Neg liver ROS, Umbilical hernia   Endo/Other  negative endocrine ROS  Renal/GU negative Renal ROS  negative genitourinary   Musculoskeletal negative musculoskeletal ROS (+)   Abdominal Normal abdominal exam  (+)   Peds negative pediatric ROS (+)  Hematology negative hematology ROS (+)   Anesthesia Other Findings   Reproductive/Obstetrics                             Anesthesia Physical Anesthesia Plan  ASA: II  Anesthesia Plan: General   Post-op Pain Management:    Induction: Intravenous  Airway Management Planned: Oral ETT  Additional Equipment:   Intra-op Plan:   Post-operative Plan: Extubation in OR  Informed Consent: I have reviewed the patients History and Physical, chart, labs and discussed the procedure including the risks, benefits and alternatives for the proposed anesthesia with the patient or authorized representative who has indicated his/her understanding and acceptance.   Dental advisory given  Plan Discussed with: CRNA and Surgeon  Anesthesia Plan Comments:         Anesthesia Quick Evaluation

## 2015-07-13 NOTE — Discharge Instructions (Signed)
Remove dressing in 24 hours. °May shower in 24 hours. °Leave paper strips in place. °Resume all home medications. °Follow-up with Dr. Cooper in 10 days. ° °AMBULATORY SURGERY  °DISCHARGE INSTRUCTIONS ° ° °1) The drugs that you were given will stay in your system until tomorrow so for the next 24 hours you should not: ° °A) Drive an automobile °B) Make any legal decisions °C) Drink any alcoholic beverage ° ° °2) You may resume regular meals tomorrow.  Today it is better to start with liquids and gradually work up to solid foods. ° °You may eat anything you prefer, but it is better to start with liquids, then soup and crackers, and gradually work up to solid foods. ° ° °3) Please notify your doctor immediately if you have any unusual bleeding, trouble breathing, redness and pain at the surgery site, drainage, fever, or pain not relieved by medication. ° ° ° °4) Additional Instructions: ° ° ° ° ° ° ° °Please contact your physician with any problems or Same Day Surgery at [phone], Monday through Friday 6 am to 4 pm, or [place] at  Main number at [phone]. °

## 2015-07-13 NOTE — Anesthesia Procedure Notes (Signed)
Procedure Name: Intubation Date/Time: 07/13/2015 7:45 AM Performed by: Henrietta Hoover Pre-anesthesia Checklist: Patient identified, Emergency Drugs available, Suction available, Patient being monitored and Timeout performed Patient Re-evaluated:Patient Re-evaluated prior to inductionOxygen Delivery Method: Circle system utilized Preoxygenation: Pre-oxygenation with 100% oxygen Intubation Type: IV induction Ventilation: Mask ventilation without difficulty Laryngoscope Size: Mac and 4 Grade View: Grade II Tube type: Oral Tube size: 7.5 mm Number of attempts: 1 Airway Equipment and Method: Stylet Placement Confirmation: ETT inserted through vocal cords under direct vision,  positive ETCO2 and breath sounds checked- equal and bilateral Secured at: 22 cm Tube secured with: Tape Dental Injury: Teeth and Oropharynx as per pre-operative assessment

## 2015-07-18 NOTE — Anesthesia Postprocedure Evaluation (Signed)
Anesthesia Post Note  Patient: Manuel Hernandez  Procedure(s) Performed: Procedure(s) (LRB): HERNIA REPAIR UMBILICAL ADULT (N/A)  Patient location during evaluation: PACU Anesthesia Type: General Level of consciousness: awake and alert and oriented Pain management: pain level controlled Vital Signs Assessment: post-procedure vital signs reviewed and stable Respiratory status: spontaneous breathing Cardiovascular status: blood pressure returned to baseline Anesthetic complications: no    Last Vitals:  Filed Vitals:   07/13/15 0924 07/13/15 0934  BP: 140/75 143/82  Pulse: 48 58  Temp: 36 C   Resp: 16 16    Last Pain:  Filed Vitals:   07/13/15 0943  PainSc: 0-No pain                 Shaleigh Laubscher

## 2015-07-23 ENCOUNTER — Ambulatory Visit (INDEPENDENT_AMBULATORY_CARE_PROVIDER_SITE_OTHER): Payer: BLUE CROSS/BLUE SHIELD | Admitting: General Surgery

## 2015-07-23 ENCOUNTER — Encounter: Payer: Self-pay | Admitting: General Surgery

## 2015-07-23 VITALS — BP 150/83 | HR 68 | Temp 98.5°F | Ht 73.0 in | Wt 241.0 lb

## 2015-07-23 DIAGNOSIS — Z4889 Encounter for other specified surgical aftercare: Secondary | ICD-10-CM

## 2015-07-23 NOTE — Patient Instructions (Signed)
Please call our office with any questions or concerns.  Please do not submerge in a tub, hot tub, or pool until incisions are completely sealed.  Use sun block to incision area over the next year if this area will be exposed to sun. This helps decrease scarring.  You may lift more than 20 lbs beginning on 08/24/15. You may return to work but you will have to go back with restrictions.  If you develop redness, drainage, or pain at incision sites- call our office immediately and speak with a nurse.

## 2015-07-23 NOTE — Progress Notes (Signed)
Outpatient Surgical Follow Up  07/23/2015  Manuel Hernandez is an 45 y.o. male.   Chief Complaint  Patient presents with  . Routine Post Op    Umbilical Hernia Repair (07/13/15)- Dr. Excell Seltzer    HPI: 45 year old male returns to clinic 10 days status post open umbilical hernia repair. Patient states he's been doing well. He denies any fevers, chills, nausea, vomiting, diarrhea, constipation, abdominal pain. He is already return to working out despite being told not to lift. He is very happy with his surgical experience.  Past Medical History  Diagnosis Date  . Umbilical hernia     Past Surgical History  Procedure Laterality Date  . No past surgeries    . Umbilical hernia repair N/A 07/13/2015    Procedure: HERNIA REPAIR UMBILICAL ADULT;  Surgeon: Lattie Haw, MD;  Location: [facility] ORS;  Service: General;  Laterality: N/A;    Family History  Problem Relation Age of Onset  . Lupus Mother   . Hypertension Father     Social History:  reports that he has never smoked. He has never used smokeless tobacco. He reports that he drinks alcohol. He reports that he does not use illicit drugs.  Allergies:  Allergies  Allergen Reactions  . Apple Shortness Of Breath and Swelling  . Other Shortness Of Breath and Swelling    NUTS-MOUTH SWELLING    Medications reviewed.    ROS A multipoint review of systems was completed, all pertinent positives and negatives were documented within the history of present illness and the remainder negative.   BP 150/83 mmHg  Pulse 68  Temp(Src) 98.5 F (36.9 C) (Oral)  Ht  (1.854 m)  Wt 109.317 kg (241 lb)  BMI 31.80 kg/m2  Physical Exam  Gen.: No acute distress  chest: Clear to auscultation Heart: Regular rate and rhythm Abdomen: Soft, nontender, nondistended. Well approximated open umbilical hernia repair without evidence of erythema or drainage. The area is currently nontender to palpation.   No results found for this or any previous  visit (from the past 48 hour(s)). No results found.  Assessment/Plan:  1. Aftercare following surgery 45 year old male 10 days status post open umbilical hernia repair. Again stressed the importance of maintaining a lifting restriction for a total of 6 weeks. Patient again voiced understanding and he would endeavor to not lift even though he works at and enjoys working out at [employer]. Discussed the signs and symptoms of infection or recurrence and to return to clinic should they occur. Otherwise he'll follow up on an as-needed basis.     Ricarda Frame, MD FACS General Surgeon  07/23/2015,11:54 AM

## 2015-09-13 ENCOUNTER — Ambulatory Visit (INDEPENDENT_AMBULATORY_CARE_PROVIDER_SITE_OTHER): Payer: BLUE CROSS/BLUE SHIELD | Admitting: Family Medicine

## 2015-09-13 VITALS — BP 114/70 | HR 45 | Temp 98.1°F | Resp 18 | Ht 73.0 in | Wt 240.4 lb

## 2015-09-13 DIAGNOSIS — J209 Acute bronchitis, unspecified: Secondary | ICD-10-CM | POA: Diagnosis not present

## 2015-09-13 MED ORDER — AZITHROMYCIN 250 MG PO TABS: ORAL_TABLET | ORAL | Status: DC

## 2015-09-13 MED ORDER — HYDROCODONE-HOMATROPINE 5-1.5 MG/5ML PO SYRP: 5.0000 mL | ORAL_SOLUTION | Freq: Three times a day (TID) | ORAL | Status: DC | PRN

## 2015-09-13 NOTE — Patient Instructions (Addendum)

## 2015-09-13 NOTE — Progress Notes (Signed)
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  No fever, no hemoptysis.  Nonproductive  Objective: BP 114/70 mmHg  Pulse 45  Temp(Src) 98.1 F (36.7 C) (Oral)  Resp 18  Ht 6\' 1"  (1.854 m)  Wt 240 lb 6.4 oz (109.045 kg)  BMI 31.72 kg/m2  SpO2 98% HEENT: unremarkable Chest:  Few ronchi  Assessment: Acute bronchitis, unspecified organism - Plan: azithromycin (ZITHROMAX) 250 MG tablet, HYDROcodone-homatropine (HYCODAN) 5-1.5 MG/5ML syrup  Elvina SidleKurt Jamiere Gulas, MD

## 2016-05-06 ENCOUNTER — Telehealth: Payer: Self-pay

## 2016-05-06 ENCOUNTER — Ambulatory Visit (INDEPENDENT_AMBULATORY_CARE_PROVIDER_SITE_OTHER): Payer: BLUE CROSS/BLUE SHIELD | Admitting: Family Medicine

## 2016-05-06 VITALS — BP 124/82 | HR 59 | Temp 98.5°F | Resp 17 | Ht 73.0 in | Wt 240.0 lb

## 2016-05-06 DIAGNOSIS — Z23 Encounter for immunization: Secondary | ICD-10-CM

## 2016-05-06 DIAGNOSIS — J069 Acute upper respiratory infection, unspecified: Secondary | ICD-10-CM | POA: Diagnosis not present

## 2016-05-06 DIAGNOSIS — H60391 Other infective otitis externa, right ear: Secondary | ICD-10-CM

## 2016-05-06 MED ORDER — OFLOXACIN 0.3 % OT SOLN: 5.0000 [drp] | Freq: Every day | OTIC | 0 refills | Status: DC

## 2016-05-06 NOTE — Patient Instructions (Addendum)
IF you received an x-ray today, you will receive an invoice from [facility]. Please contact [facility] at [phone] with questions or concerns regarding your invoice.   IF you received labwork today, you will receive an invoice from [facility]. Please contact [facility] at [phone] with questions or concerns regarding your invoice.   Our billing staff will not be able to assist you with questions regarding bills from these companies.  You will be contacted with the lab results as soon as they are available. The fastest way to get your results is to activate your My Chart account. Instructions are located on the last page of this paperwork. If you have not heard from us regarding the results in 2 weeks, please contact this office.      Viral Respiratory Infection A respiratory infection is an illness that affects part of the respiratory system, such as the lungs, nose, or throat. Most respiratory infections are caused by either viruses or bacteria. A respiratory infection that is caused by a virus is called a viral respiratory infection. Common types of viral respiratory infections include:  A cold.  The flu (influenza).  A respiratory syncytial virus (RSV) infection. How do I know if I have a viral respiratory infection? Most viral respiratory infections cause:  A stuffy or runny nose.  Yellow or green nasal discharge.  A cough.  Sneezing.  Fatigue.  Achy muscles.  A sore throat.  Sweating or chills.  A fever.  A headache. How are viral respiratory infections treated? If influenza is diagnosed early, it may be treated with an antiviral medicine that shortens the length of time a person has symptoms. Symptoms of viral respiratory infections may be treated with over-the-counter and prescription medicines, such as:  Expectorants. These make it easier to cough up mucus.  Decongestant nasal sprays. Health care  providers do not prescribe antibiotic medicines for viral infections. This is because antibiotics are designed to kill bacteria. They have no effect on viruses. How do I know if I should stay home from work or school? To avoid exposing others to your respiratory infection, stay home if you have:  A fever.  A persistent cough.  A sore throat.  A runny nose.  Sneezing.  Muscles aches.  Headaches.  Fatigue.  Weakness.  Chills.  Sweating.  Nausea. Follow these instructions at home:  Rest as much as possible.  Take over-the-counter and prescription medicines only as told by your health care provider.  Drink enough fluid to keep your urine clear or pale yellow. This helps prevent dehydration and helps loosen up mucus.  Gargle with a salt-water mixture 3-4 times per day or as needed. To make a salt-water mixture, completely dissolve -1 tsp of salt in 1 cup of warm water.  Use nose drops made from salt water to ease congestion and soften raw skin around your nose.  Do not drink alcohol.  Do not use tobacco products, including cigarettes, chewing tobacco, and e-cigarettes. If you need help quitting, ask your health care provider. Contact a health care provider if:  Your symptoms last for 10 days or longer.  Your symptoms get worse over time.  You have a fever.  You have severe sinus pain in your face or forehead.  The glands in your jaw or neck become very swollen. Get help right away if:  You feel pain or pressure in your chest.  You have shortness of breath.  You faint or feel like you  will faint.  You have severe and persistent vomiting.  You feel confused or disoriented. This information is not intended to replace advice given to you by your health care provider. Make sure you discuss any questions you have with your health care provider. Document Released: 02/19/2005 Document Revised: 10/18/2015 Document Reviewed: 10/18/2014 Elsevier Interactive Patient  Education  2017 [employer]

## 2016-05-06 NOTE — Progress Notes (Signed)
  Chief Complaint  Patient presents with  . Cough    and congestion since Thursday     HPI   Patient reports that he started coughing 5 days ago  He started having a productive cough white phlegm with chills His symptoms started improving 3 days ago  He continues to has cough though it is lessened but still has chest congestion He denies shortness of breath He hears an occasional wheeze He is not a smoker, no history of asthma, no seasonal allergies   Past Medical History:  Diagnosis Date  . Umbilical hernia     Current Outpatient Prescriptions  Medication Sig Dispense Refill  . Multiple Vitamin (MULTIVITAMIN) tablet Take 1 tablet by mouth daily.    Marland Kitchen. HYDROcodone-homatropine (HYCODAN) 5-1.5 MG/5ML syrup Take 5 mLs by mouth every 8 (eight) hours as needed for cough. (Patient not taking: Reported on 05/06/2016) 120 mL 0  . ofloxacin (FLOXIN) 0.3 % otic solution Place 5 drops into the left ear daily. For 5 day 5 mL 0   No current facility-administered medications for this visit.     Allergies:  Allergies  Allergen Reactions  . Apple Shortness Of Breath and Swelling  . Other Shortness Of Breath and Swelling    NUTS-MOUTH SWELLING    Past Surgical History:  Procedure Laterality Date  . NO PAST SURGERIES    . UMBILICAL HERNIA REPAIR N/A 07/13/2015   Procedure: HERNIA REPAIR UMBILICAL ADULT;  Surgeon: Lattie Hawichard E Cooper, MD;  Location: [facility] ORS;  Service: General;  Laterality: N/A;    Social History   Social History  . Marital status: Married    Spouse name: N/A  . Number of children: N/A  . Years of education: N/A   Social History Main Topics  . Smoking status: Never Smoker  . Smokeless tobacco: Never Used  . Alcohol use 0.0 oz/week     Comment: occas  . Drug use: No  . Sexual activity: Not Asked   Other Topics Concern  . None   Social History Narrative  . None    ROS  Objective: Vitals:   05/06/16 1152  BP: 124/82  Pulse: (!) 59  Resp: 17  Temp:  98.5 F (36.9 C)  TempSrc: Oral  SpO2: 97%  Weight: 240 lb (108.9 kg)  Height: 6\' 1"  (1.854 m)    Physical Exam General: alert, oriented, in NAD Head: normocephalic, atraumatic, no sinus tenderness Eyes: EOM intact, no scleral icterus or conjunctival injection Ears: TM on the left with erythema, TM on the right clear Throat: no pharyngeal exudate or erythema Lymph: no posterior auricular, submental or cervical lymph adenopathy Heart: normal rate, normal sinus rhythm, no murmurs Lungs: clear to auscultation bilaterally, no wheezing   Assessment and Plan Berna SpareMarcus was seen today for cough.  Diagnoses and all orders for this visit:  Infective otitis externa of right ear- advised to avoid using anything smaller than a finger to scratch the ear -     ofloxacin (FLOXIN) 0.3 % otic solution; Place 5 drops into the left ear daily. For 5 day  Acute URI- advised supportive care  Flu vaccine need -     Flu Vaccine QUAD 36+ mos IM       Iasiah Ozment A Countess Biebel

## 2016-05-06 NOTE — Telephone Encounter (Signed)
Pt just saw Creta LevinStallings and thought that she was going to call in a z-pac along with the drops but nothing is at the pharmacy but the drops and he would like a confirmation  Best number [phone]

## 2016-05-07 ENCOUNTER — Telehealth: Payer: Self-pay | Admitting: Emergency Medicine

## 2016-05-07 NOTE — Telephone Encounter (Signed)
Left message per Dr. Eldred MangesStalling [facility]S instructions, pt to use antibiotic drops and not Z-pak. Advised to return call if he has further questions or concerns

## 2016-07-12 IMAGING — CT CT ABDOMEN W/ CM
3 of 5 series · 13 of 36 positions shown, 19 images · IV contrast (READICAT/WATER & [ID] ISOVUE 300)
Comparison: None.

CLINICAL DATA: Umbilical pain, especially when lifting weights. No
history of malignancy or prior relevant surgery. Initial encounter.

EXAM:
CT ABDOMEN WITH CONTRAST
TECHNIQUE: Multidetector CT imaging of the abdomen was performed using the
standard protocol following bolus administration of intravenous
contrast.
CONTRAST:  100mL [id number] IOPAMIDOL ([id number]) INJECTION 61%

[Series 3: abdomen with · axial · 0.70mm/px · z∈[-314,-89]mm · 6 of 63 slices shown, 11 images]
[im 9/63  soft-tissue]
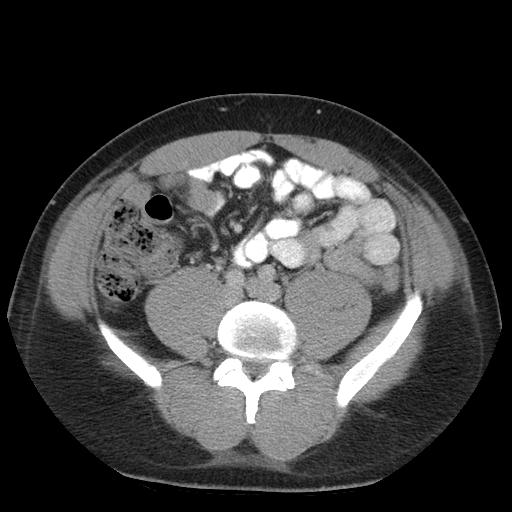
[im 9/63  bone]
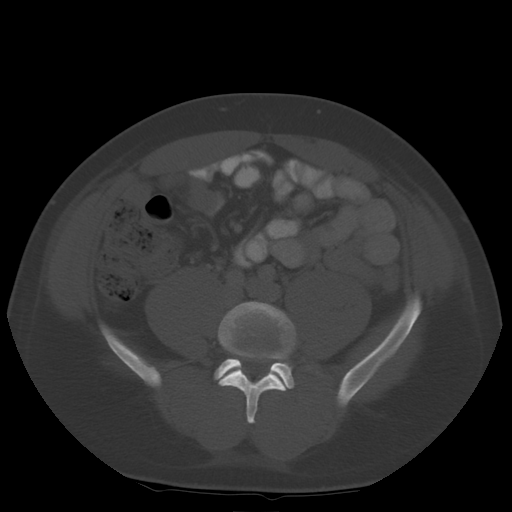
[im 18/63  soft-tissue]
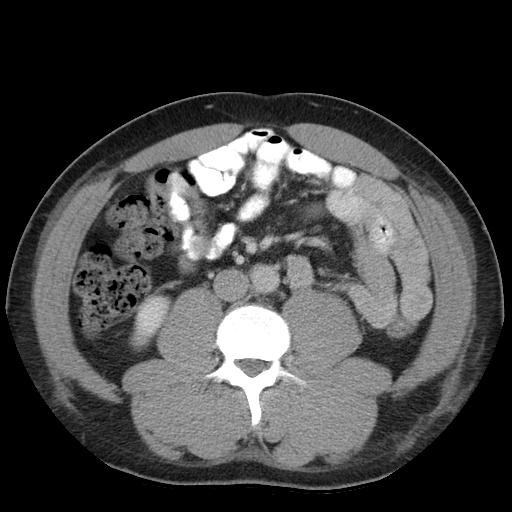
[im 27/63  soft-tissue]
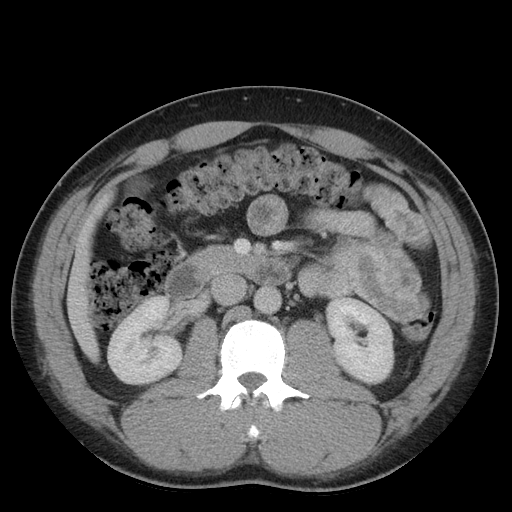
[im 27/63  lung]
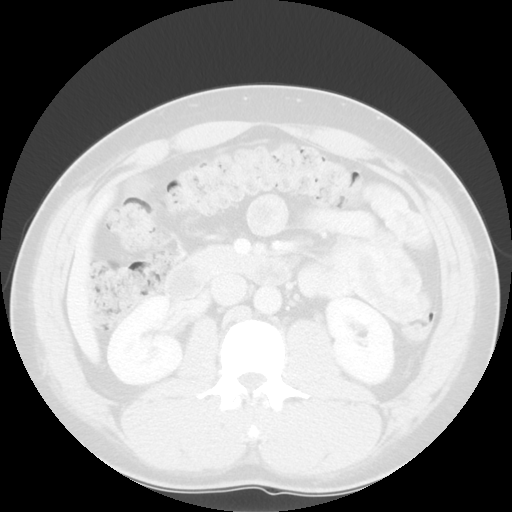
[im 36/63  soft-tissue]
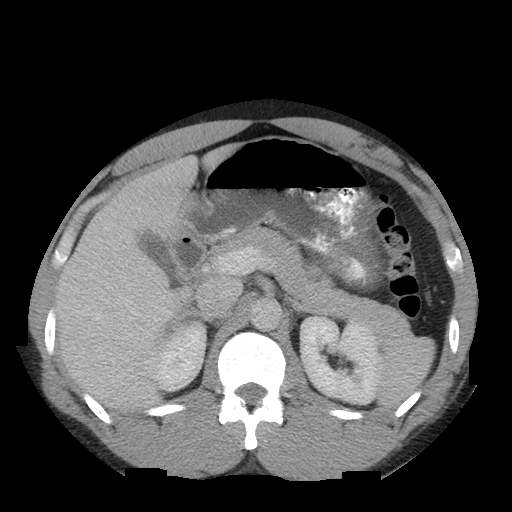
[im 36/63  lung]
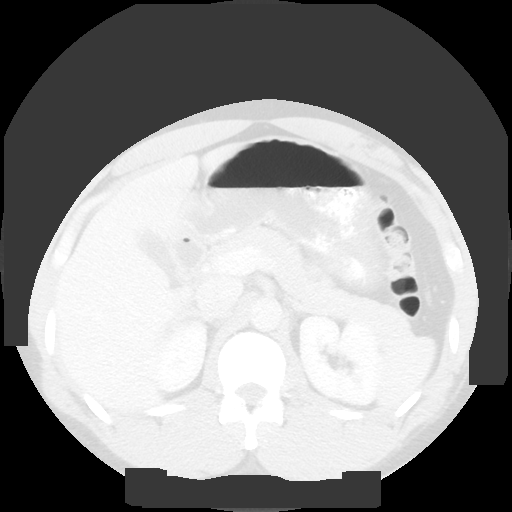
[im 45/63  soft-tissue]
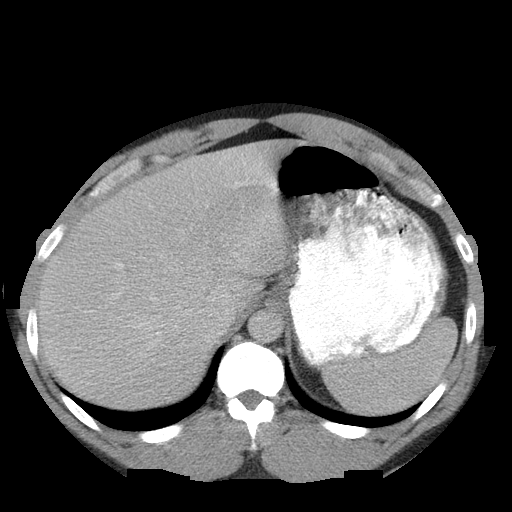
[im 45/63  lung]
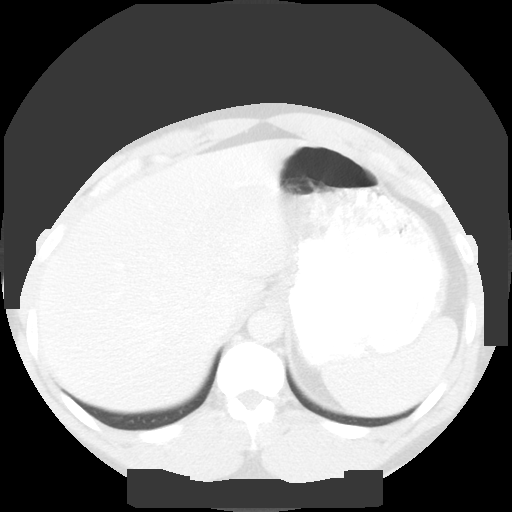
[im 54/63  soft-tissue]
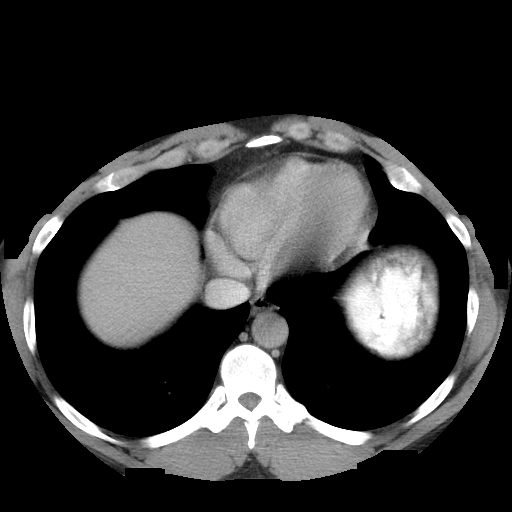
[im 54/63  lung]
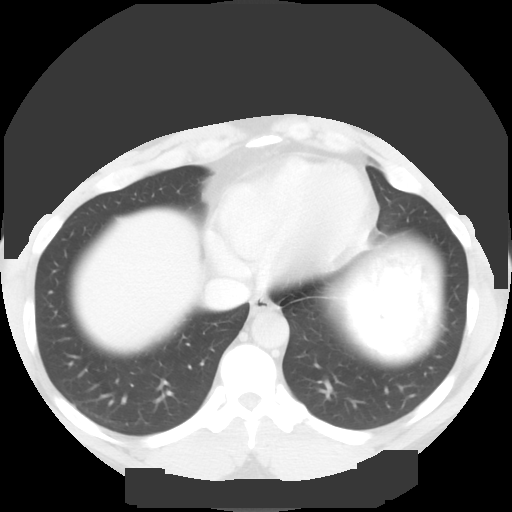

[Series 601: coronal body · coronal · 0.70mm/px · 1 of 119 slices shown, 2 images]
[im 40/119  soft-tissue]
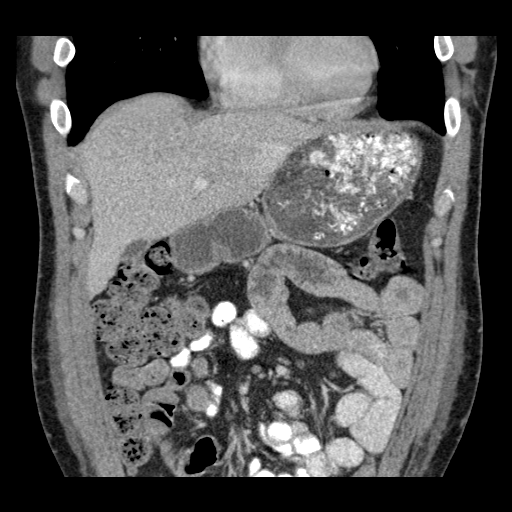
[im 40/119  bone]
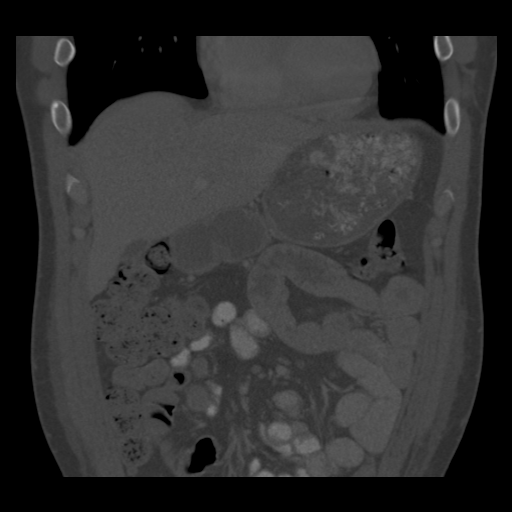

[Series 602: sagittal body · sagittal · 0.70mm/px · 6 of 145 slices shown]
[im 10/145  soft-tissue]
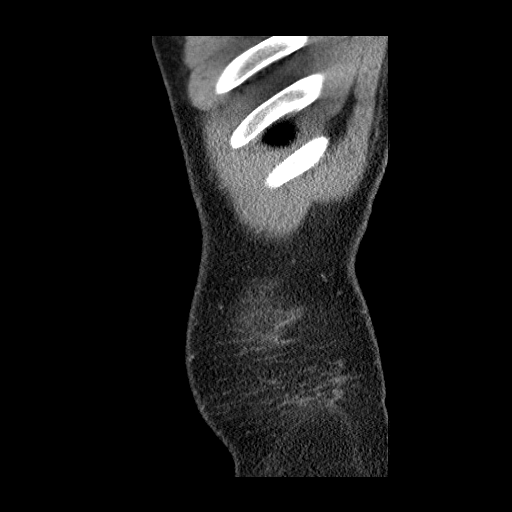
[im 28/145  soft-tissue]
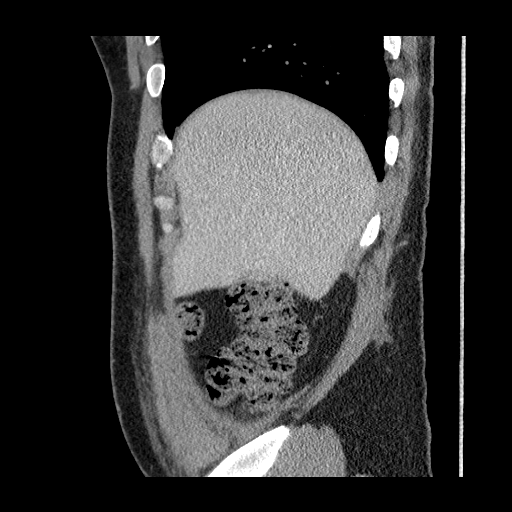
[im 46/145  soft-tissue]
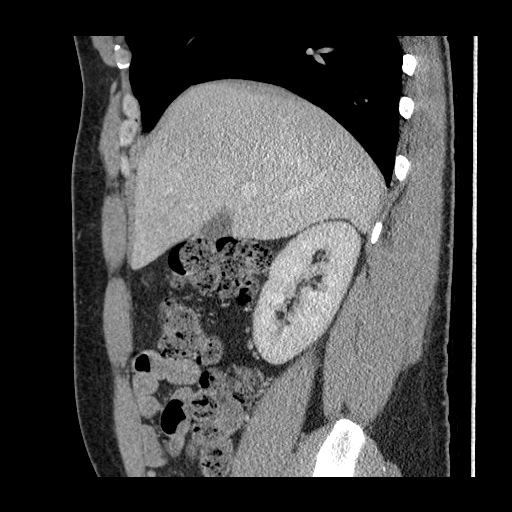
[im 64/145  soft-tissue]
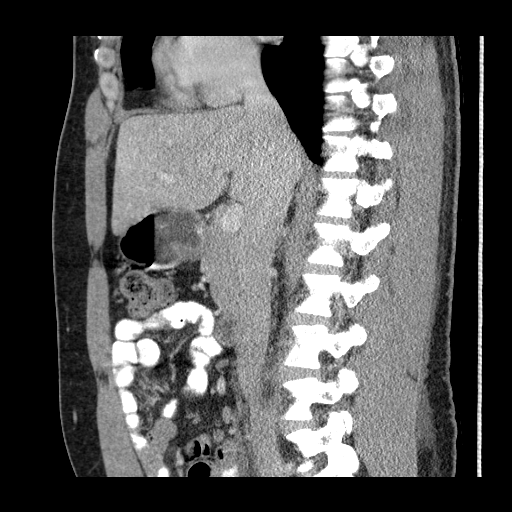
[im 82/145  soft-tissue]
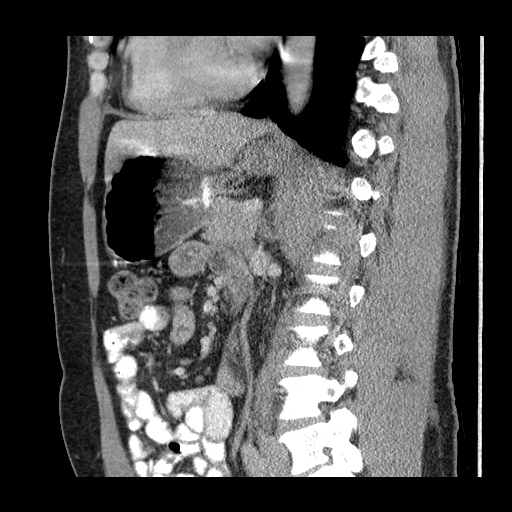
[im 100/145  soft-tissue]
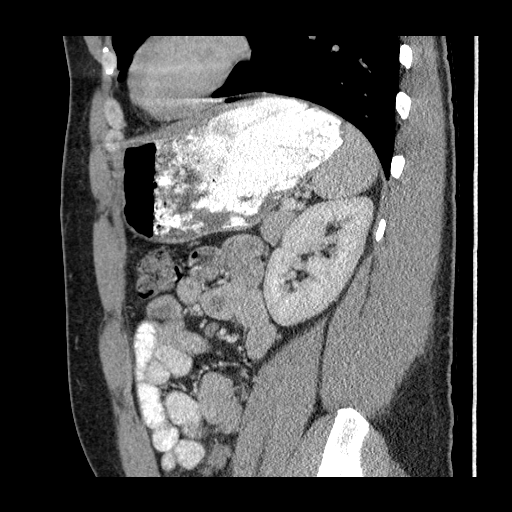

[13 of 36 positions shown; findings below may reference images not displayed]

FINDINGS: Lower chest: Clear lung bases. No significant pleural or pericardial
effusion.

Hepatobiliary: The liver is normal in density without focal
abnormality. No evidence of gallstones, gallbladder wall thickening
or biliary dilatation.

Pancreas: Unremarkable. No pancreatic ductal dilatation or
surrounding inflammatory changes.

Spleen: Normal in size without focal abnormality.

Adrenals/Urinary Tract: Both adrenal glands appear normal.The
kidneys appear normal without evidence of urinary tract calculus or
hydronephrosis. Bladder not imaged.

Stomach/Bowel: No evidence of bowel wall thickening, distention or
surrounding inflammatory change.There is ingested material within
the stomach. There is prominent stool throughout the visualized
colon. The appendix is partially imaged and appears normal.

Vascular/Lymphatic: There are no enlarged abdominal lymph nodes. No
significant vascular findings are present.

Other: Tiny umbilical hernia containing only fat. The anterior
abdominal wall musculature appears normal. The pelvis was not
imaged.

Musculoskeletal: No acute or significant osseous findings. Probable
tiny bone island within the right seventh rib laterally.
IMPRESSION: 1. No acute findings or explanation for the patient's symptoms
within the abdomen. The pelvis was not imaged.
2. Tiny umbilical hernia containing only fat.
3. Prominent stool throughout the colon suggesting constipation.

## 2017-07-09 ENCOUNTER — Ambulatory Visit: Payer: BLUE CROSS/BLUE SHIELD | Admitting: Pulmonary Disease

## 2017-07-09 ENCOUNTER — Encounter: Payer: Self-pay | Admitting: Pulmonary Disease

## 2017-07-09 VITALS — BP 122/78 | HR 46 | Ht 73.0 in | Wt 234.0 lb

## 2017-07-09 DIAGNOSIS — R29818 Other symptoms and signs involving the nervous system: Secondary | ICD-10-CM

## 2017-07-09 NOTE — Progress Notes (Signed)
   Subjective:    Patient ID: Manuel SimmersMarcus Hernandez, male    DOB: December 21, 1970, 47 y.o.   MRN: [id number]  HPI    Review of Systems  Constitutional: Negative for fever and unexpected weight change.  HENT: Positive for dental problem. Negative for congestion, ear pain, nosebleeds, postnasal drip, rhinorrhea, sinus pressure, sneezing, sore throat and trouble swallowing.   Eyes: Negative for redness and itching.  Respiratory: Negative for cough, chest tightness, shortness of breath and wheezing.   Cardiovascular: Negative for palpitations and leg swelling.  Gastrointestinal: Negative for nausea and vomiting.  Genitourinary: Negative for dysuria.  Musculoskeletal: Positive for joint swelling.  Skin: Negative for rash.  Allergic/Immunologic: Positive for food allergies. Negative for environmental allergies and immunocompromised state.  Neurological: Positive for headaches.  Hematological: Does not bruise/bleed easily.  Psychiatric/Behavioral: Negative for dysphoric mood. The patient is not nervous/anxious.        Objective:   Physical Exam        Assessment & Plan:

## 2017-07-09 NOTE — Progress Notes (Signed)
[facility]  Chief Complaint  Patient presents with  . sleep consult    Pt self referral. Pt's wife states he snores loudly, gasps for air, and stops breathing at times. Pt works out daily for 1 1/2 hrs each time in the mornings.    Vital signs: BP 122/78 (BP Location: Left Arm, Cuff Size: Normal)   Pulse (!) 46   Ht 6\' 1"  (1.854 m)   Wt 234 lb (106.1 kg)   SpO2 100%   BMI 30.87 kg/m   History of Present Illness: Manuel Hernandez is a 47 y.o. male for evaluation of sleep problems.  His wife has been concerned about his snoring.  She also has told him that he will stop breathing while asleep.  He has trouble sleeping on his back, and his mouth gets dry at night.  He doesn't dream much.  He can fall asleep easily whenever he is sitting quiet.  He goes to sleep at 1030 pm.  He falls asleep in 10 minutes.  He wakes up at 4 am to use the bathroom.  He gets out of bed at 530 am.  He feels okay in the morning.  He has noticed getting more frequent headaches during the day.  He does not use anything to help him fall sleep.  He uses an energy drink prior to his workouts.  He works as a Marketing executivefitness instructor.  He denies sleep walking, sleep talking, bruxism, or nightmares.  There is no history of restless legs.  He denies sleep hallucinations, sleep paralysis, or cataplexy.  The Epworth score is 18 out of 24.   Physical Exam:  General - pleasant Eyes - pupils reactive ENT - no sinus tenderness, no oral exudate, no LAN, MP 4, enlarged tongue Cardiac - regular, no murmur Chest - no wheeze, rales Abd - soft, non tender Ext - no edema Skin - no rashes Neuro - normal strength Psych - normal mood  Discussion: He has snoring, sleep disruption, apnea, and daytime sleepiness.  He has upper airway anatomy suggestive of risk for sleep apnea. I am concerned he could have sleep apnea.  We discussed how sleep apnea can affect various health problems, including  risks for hypertension, cardiovascular disease, and diabetes.  We also discussed how sleep disruption can increase risks for accidents, such as while driving.  Weight loss as a means of improving sleep apnea was also reviewed.  Additional treatment options discussed were CPAP therapy, oral appliance, and surgical intervention.  Assessment/Plan:  Suspected sleep apnea. - will arrange for home sleep study pending insurance approval   Patient Instructions  Will arrange for home sleep study Will call to arrange for follow up after sleep study reviewed    Coralyn HellingVineet Afton Mikelson, MD [facility] 07/09/2017, 11:51 AM Pager:  [phone]  Flow Sheet  Sleep tests:  Review of Systems: Constitutional: Negative for fever and unexpected weight change.  HENT: Positive for dental problem. Negative for congestion, ear pain, nosebleeds, postnasal drip, rhinorrhea, sinus pressure, sneezing, sore throat and trouble swallowing.   Eyes: Negative for redness and itching.  Respiratory: Negative for cough, chest tightness, shortness of breath and wheezing.   Cardiovascular: Negative for palpitations and leg swelling.  Gastrointestinal: Negative for nausea and vomiting.  Genitourinary: Negative for dysuria.  Musculoskeletal: Positive for joint swelling.  Skin: Negative for rash.  Allergic/Immunologic: Positive for food allergies. Negative for environmental allergies and immunocompromised state.  Neurological: Positive for headaches.  Hematological: Does not bruise/bleed  easily.  Psychiatric/Behavioral: Negative for dysphoric mood. The patient is not nervous/anxious.    Past Medical History: He  has a past medical history of Umbilical hernia.  Past Surgical History: He  has a past surgical history that includes No past surgeries and Umbilical hernia repair (N/A, 07/13/2015).  Family History: His family history includes Hypertension in his father; Lupus in his mother.  Social History: He   reports that  has never smoked. he has never used smokeless tobacco. He reports that he drinks alcohol. He reports that he does not use drugs.  Medications: Allergies as of 07/09/2017      Reactions   Apple Shortness Of Breath, Swelling   Other Shortness Of Breath, Swelling   NUTS-MOUTH SWELLING      Medication List        Accurate as of 07/09/17 11:51 AM. Always use your most recent med list.          multivitamin tablet Take 1 tablet by mouth daily.

## 2017-07-09 NOTE — Patient Instructions (Signed)
Will arrange for home sleep study Will call to arrange for follow up after sleep study reviewed  

## 2017-07-23 DIAGNOSIS — G4733 Obstructive sleep apnea (adult) (pediatric): Secondary | ICD-10-CM | POA: Diagnosis not present

## 2017-07-24 ENCOUNTER — Other Ambulatory Visit: Payer: Self-pay | Admitting: *Deleted

## 2017-07-24 DIAGNOSIS — R29818 Other symptoms and signs involving the nervous system: Secondary | ICD-10-CM

## 2017-08-04 ENCOUNTER — Telehealth: Payer: Self-pay | Admitting: Pulmonary Disease

## 2017-08-04 ENCOUNTER — Encounter: Payer: Self-pay | Admitting: Pulmonary Disease

## 2017-08-04 DIAGNOSIS — G4733 Obstructive sleep apnea (adult) (pediatric): Secondary | ICD-10-CM

## 2017-08-04 HISTORY — DX: Obstructive sleep apnea (adult) (pediatric): G47.33

## 2017-08-04 NOTE — Telephone Encounter (Signed)
HST 07/23/17 >> AHI 21.4, SaO2 low 83%   Will have my nurse inform pt that sleep study shows moderate sleep apnea.  Options are 1) CPAP now, 2) referral for oral appliance, 3) ROV first.  If He is agreeable to CPAP, then please send order for auto CPAP range 5 to 15 cm H2O with heated humidity and mask of choice.  Have download sent 1 month after starting CPAP and set up ROV 2 months after starting CPAP.  ROV can be with me or NP.

## 2017-08-05 NOTE — Telephone Encounter (Signed)
Left voice mail on machine for patient to return phone call back regarding results. X1  

## 2017-08-07 NOTE — Telephone Encounter (Signed)
lmtcb x2 for pt. 

## 2017-08-10 NOTE — Telephone Encounter (Signed)
Attempted to call pt but unable to reach him. Left message for pt to return our call x3. Since this is the third attempt trying to reach pt, will send letter to address we have on file.

## 2017-08-11 ENCOUNTER — Telehealth: Payer: Self-pay | Admitting: Pulmonary Disease

## 2017-08-11 NOTE — Telephone Encounter (Signed)
ATC pt, no answer. Left message for pt to call back.    Coralyn HellingSood, Vineet, MD      8:40 AM  Note    HST 07/23/17 >> AHI 21.4, SaO2 low 83%   Will have my nurse inform pt that sleep study shows moderate sleep apnea.  Options are 1) CPAP now, 2) referral for oral appliance, 3) ROV first.  If He is agreeable to CPAP, then please send order for auto CPAP range 5 to 15 cm H2O with heated humidity and mask of choice.  Have download sent 1 month after starting CPAP and set up ROV 2 months after starting CPAP.  ROV can be with me or NP.

## 2017-08-12 NOTE — Telephone Encounter (Signed)
lmtcb x1 for pt. 

## 2017-08-12 NOTE — Telephone Encounter (Signed)
Pt is calling back [phone]

## 2017-08-12 NOTE — Telephone Encounter (Signed)
LMTCB

## 2017-08-12 NOTE — Telephone Encounter (Signed)
Pt is calling back [phone] 

## 2017-08-13 NOTE — Telephone Encounter (Signed)
Pt is aware of results and voiced his understanding. Pt would like to come in for OV to discuss results prior to starting therapy. Apt has been scheduled for 08/17/17 at 3;15. Nothing further is needed.

## 2017-08-17 ENCOUNTER — Encounter: Payer: Self-pay | Admitting: Pulmonary Disease

## 2017-08-17 ENCOUNTER — Ambulatory Visit: Payer: BLUE CROSS/BLUE SHIELD | Admitting: Pulmonary Disease

## 2017-08-17 VITALS — BP 136/76 | HR 75 | Ht 73.0 in | Wt 238.6 lb

## 2017-08-17 DIAGNOSIS — Z7189 Other specified counseling: Secondary | ICD-10-CM

## 2017-08-17 DIAGNOSIS — G4733 Obstructive sleep apnea (adult) (pediatric): Secondary | ICD-10-CM

## 2017-08-17 NOTE — Progress Notes (Signed)
[facility]  Chief Complaint  Patient presents with  . Follow-up    follow up for sleep study results    Vital signs: BP 136/76 (BP Location: Left Arm, Cuff Size: Normal)   Pulse 75   Ht 6\' 1"  (1.854 m)   Wt 238 lb 9.6 oz (108.2 kg)   SpO2 97%   BMI 31.48 kg/m   History of Present Illness: Manuel Hernandez is a 47 y.o. male with obstructive sleep apnea.  He is here to review his sleep study.  This showed moderate sleep apnea.  Physical Exam:  General - pleasant Eyes - pupils reactive, wears glasses ENT - no sinus tenderness, no oral exudate, no LAN, MP 4, enlarged tongue Cardiac - regular, no murmur Chest - no wheeze, rales Abd - soft, non tender Ext - no edema Skin - no rashes Neuro - normal strength Psych - normal mood   Assessment/Plan:  Obstructive sleep apnea. - We discussed how sleep apnea can affect various health problems, including risks for hypertension, cardiovascular disease, and diabetes.  We also discussed how sleep disruption can increase risks for accidents, such as while driving.  Weight loss as a means of improving sleep apnea was also reviewed.  Additional treatment options discussed were CPAP therapy, oral appliance, and surgical intervention. - will arrange for auto CPAP set up   Patient Instructions  Will arrange for CPAP set up  Follow up in 2 months   Coralyn HellingVineet Kyana Aicher, MD [facility] 08/17/2017, 3:49 PM Pager:  [phone]  Flow Sheet  Sleep tests: HST 07/23/17 >> AHI 21.4, SaO2 low 83%  Past Medical History: He  has a past medical history of OSA (obstructive sleep apnea) (08/04/2017) and Umbilical hernia.  Past Surgical History: He  has a past surgical history that includes No past surgeries and Umbilical hernia repair (N/A, 07/13/2015).  Family History: His family history includes Hypertension in his father; Lupus in his mother.  Social History: He  reports that he has never  smoked. He has never used smokeless tobacco. He reports that he drinks alcohol. He reports that he does not use drugs.  Medications: Allergies as of 08/17/2017      Reactions   Apple Shortness Of Breath, Swelling   Other Shortness Of Breath, Swelling   NUTS-MOUTH SWELLING      Medication List        Accurate as of 08/17/17  3:49 PM. Always use your most recent med list.          multivitamin tablet Take 1 tablet by mouth daily.

## 2017-08-17 NOTE — Patient Instructions (Signed)
Will arrange for CPAP set up Follow up in 2 months 

## 2017-08-17 NOTE — Addendum Note (Signed)
Addended by: Coralyn HellingSOOD, Burnett Lieber on: 08/17/2017 03:51 PM   Modules accepted: Orders

## 2017-10-16 ENCOUNTER — Encounter: Payer: Self-pay | Admitting: Pulmonary Disease

## 2017-10-28 ENCOUNTER — Ambulatory Visit: Payer: BLUE CROSS/BLUE SHIELD | Admitting: Pulmonary Disease

## 2017-11-25 ENCOUNTER — Ambulatory Visit (INDEPENDENT_AMBULATORY_CARE_PROVIDER_SITE_OTHER): Payer: BLUE CROSS/BLUE SHIELD | Admitting: Physician Assistant

## 2017-11-25 ENCOUNTER — Encounter: Payer: Self-pay | Admitting: Physician Assistant

## 2017-11-25 ENCOUNTER — Other Ambulatory Visit: Payer: Self-pay

## 2017-11-25 VITALS — BP 134/94 | HR 48 | Temp 98.7°F | Resp 16 | Ht 72.0 in | Wt 232.2 lb

## 2017-11-25 DIAGNOSIS — J310 Chronic rhinitis: Secondary | ICD-10-CM | POA: Diagnosis not present

## 2017-11-25 DIAGNOSIS — T485X5A Adverse effect of other anti-common-cold drugs, initial encounter: Secondary | ICD-10-CM

## 2017-11-25 DIAGNOSIS — T485X1A Poisoning by other anti-common-cold drugs, accidental (unintentional), initial encounter: Secondary | ICD-10-CM | POA: Diagnosis not present

## 2017-11-25 MED ORDER — FLUTICASONE PROPIONATE 50 MCG/ACT NA SUSP: 2.0000 | Freq: Every day | NASAL | 12 refills | Status: AC

## 2017-11-25 NOTE — Progress Notes (Signed)
   Manuel SimmersMarcus Hernandez  MRN: [id number] DOB: [birth year]  PCP: Patient, No Pcp Per  Subjective:  Pt is a 47 year old male who presents to clinic for stuffy nose x 2 months.  He has been using equate nasal spray for over a month.  The nasal spray now makes his nose bleed.  His nose feels congested. Denies cough, fever, chills, fatigue, sneezing, watery eyes, runny nose  Review of Systems  Constitutional: Negative for chills and fever.  HENT: Positive for congestion and nosebleeds. Negative for ear discharge, ear pain, postnasal drip, rhinorrhea, sinus pressure, sinus pain and sore throat.     Patient Active Problem List   Diagnosis Date Noted  . OSA (obstructive sleep apnea) 08/04/2017  . Umbilical hernia without obstruction and without gangrene   . Umbilical hernia 01/23/2015    Current Outpatient Medications on File Prior to Visit  Medication Sig Dispense Refill  . Multiple Vitamin (MULTIVITAMIN) tablet Take 1 tablet by mouth daily.     No current facility-administered medications on file prior to visit.     Allergies  Allergen Reactions  . Apple Shortness Of Breath and Swelling  . Other Shortness Of Breath and Swelling    NUTS-MOUTH SWELLING     Objective:  BP (!) 134/94 (BP Location: Left Arm, Patient Position: Sitting, Cuff Size: Large)   Pulse (!) 48   Temp 98.7 F (37.1 C) (Oral)   Resp 16   Ht 6' (1.829 m)   Wt 232 lb 3.2 oz (105.3 kg)   SpO2 97%   BMI 31.49 kg/m   Physical Exam  Constitutional: He is oriented to person, place, and time. No distress.  HENT:  Right Ear: Tympanic membrane normal.  Left Ear: Tympanic membrane normal.  Nose: Mucosal edema present. No rhinorrhea. Right sinus exhibits no maxillary sinus tenderness and no frontal sinus tenderness. Left sinus exhibits no maxillary sinus tenderness and no frontal sinus tenderness.  Mouth/Throat: Oropharynx is clear and moist and mucous membranes are normal.  Right nostril mild bleeding distal  nares. Bilateral nares are patent  Cardiovascular: Normal rate, regular rhythm and normal heart sounds.  Pulmonary/Chest: Effort normal and breath sounds normal. No respiratory distress. He has no wheezes. He has no rales.  Neurological: He is alert and oriented to person, place, and time.  Skin: Skin is warm and dry.  Psychiatric: Judgment normal.  Vitals reviewed.   Assessment and Plan :  1. Rhinitis medicamentosa -Patient presents with non-resolving stuffy nose, using phenylephrine nasal sprays. Suspect rebound congestion.  Advised patient to stop current nasal spray.  Start nasal saline rinse daily, will add Flonase nasal spray.  He is to try a humidifier in his room at night.  Return to clinic if no improvement in 1 month, consider oral prednisone. - fluticasone (FLONASE) 50 MCG/ACT nasal spray; Place 2 sprays into both nostrils daily.  Dispense: 16 g; Refill: 12   Whitney Robbye Dede, PA-C  [facility] 11/25/2017 10:53 AM

## 2017-11-25 NOTE — Patient Instructions (Addendum)
Your Equate nasal spray may have caused a rebound nasal congestion. Frequent or prolonged use of Phenylephrine (Equate) Nasal spray may cause nasal congestion to recur or worsen. Be aware that your nasal congestion will probably worsen temporarily with withdrawal of Equate.  Stop using Equate. Give your nose a break from medication for at least one week. Start using a saline nasal spray (this is like a bath for your nose)  Use this daily.  (Neli med is a good product) If you are not improving in 5-7 days start using Flonase nasal spray. (this is a intranasal glucocorticoid) 2 sprays each nostril once daily  Put a humidifier in your room at night when you sleep.    Let me know if you are still not improving after 1 month. We can try oral steroids (prednisone)  Thank you for coming in today. I hope you feel we met your needs.  Feel free to call PCP if you have any questions or further requests.  Please consider signing up for MyChart if you do not already have it, as this is a great way to communicate with me.  Best,  Whitney McVey, PA-C   IF you received an x-ray today, you will receive an invoice from [facility]. Please contact [facility] at [phone] with questions or concerns regarding your invoice.   IF you received labwork today, you will receive an invoice from [facility]. Please contact [facility] at [phone] with questions or concerns regarding your invoice.   Our billing staff will not be able to assist you with questions regarding bills from these companies.  You will be contacted with the lab results as soon as they are available. The fastest way to get your results is to activate your My Chart account. Instructions are located on the last page of this paperwork. If you have not heard from [place] regarding the results in 2 weeks, please contact this office.

## 2020-05-03 ENCOUNTER — Telehealth: Payer: Self-pay | Admitting: Pulmonary Disease

## 2020-05-03 NOTE — Telephone Encounter (Signed)
Pt's HST results from 2019 has been faxed to Dr. Kathi Der office in [place]. Nothing further needed.
